# Patient Record
Sex: Male | Born: 1935
Health system: Southern US, Community
[De-identification: ages and names within clinical notes are randomized; demographics above are authoritative.]

## PROBLEM LIST (undated history)

## (undated) DIAGNOSIS — I2510 Atherosclerotic heart disease of native coronary artery without angina pectoris: Secondary | ICD-10-CM

## (undated) DIAGNOSIS — I10 Essential (primary) hypertension: Secondary | ICD-10-CM

## (undated) DIAGNOSIS — IMO0001 Reserved for inherently not codable concepts without codable children: Secondary | ICD-10-CM

## (undated) DIAGNOSIS — I499 Cardiac arrhythmia, unspecified: Secondary | ICD-10-CM

## (undated) DIAGNOSIS — R609 Edema, unspecified: Secondary | ICD-10-CM

## (undated) DIAGNOSIS — I340 Nonrheumatic mitral (valve) insufficiency: Secondary | ICD-10-CM

## (undated) HISTORY — DX: Atherosclerotic heart disease of native coronary artery without angina pectoris: I25.10

## (undated) HISTORY — DX: Essential (primary) hypertension: I10

## (undated) HISTORY — DX: Nonrheumatic mitral (valve) insufficiency: I34.0

## (undated) HISTORY — DX: Edema, unspecified: R60.9

## (undated) HISTORY — DX: Cardiac arrhythmia, unspecified: I49.9

## (undated) HISTORY — PX: OTHER SURGICAL HISTORY: SHX169

## (undated) HISTORY — DX: Reserved for inherently not codable concepts without codable children: IMO0001

## (undated) HISTORY — PX: LUMBAR LAMINECTOMY: SHX95

## (undated) HISTORY — PX: INGUINAL HERNIA REPAIR: SHX194

---

## 1998-02-03 ENCOUNTER — Ambulatory Visit (HOSPITAL_COMMUNITY): Admission: RE | Admit: 1998-02-03 | Discharge: 1998-02-03 | Payer: Self-pay | Admitting: Cardiology

## 2003-06-26 ENCOUNTER — Ambulatory Visit (HOSPITAL_COMMUNITY): Admission: RE | Admit: 2003-06-26 | Discharge: 2003-06-26 | Payer: Self-pay | Admitting: Gastroenterology

## 2005-02-22 ENCOUNTER — Encounter: Admission: RE | Admit: 2005-02-22 | Discharge: 2005-02-22 | Payer: Self-pay | Admitting: Orthopedic Surgery

## 2006-10-25 HISTORY — PX: CORONARY ARTERY BYPASS GRAFT: SHX141

## 2007-05-30 ENCOUNTER — Inpatient Hospital Stay (HOSPITAL_COMMUNITY): Admission: EM | Admit: 2007-05-30 | Discharge: 2007-06-12 | Payer: Self-pay | Admitting: Emergency Medicine

## 2007-06-01 ENCOUNTER — Encounter (INDEPENDENT_AMBULATORY_CARE_PROVIDER_SITE_OTHER): Payer: Self-pay | Admitting: Cardiology

## 2007-06-02 ENCOUNTER — Ambulatory Visit: Payer: Self-pay | Admitting: Cardiothoracic Surgery

## 2007-06-30 ENCOUNTER — Encounter: Admission: RE | Admit: 2007-06-30 | Discharge: 2007-06-30 | Payer: Self-pay | Admitting: Cardiothoracic Surgery

## 2007-06-30 ENCOUNTER — Ambulatory Visit: Payer: Self-pay | Admitting: Cardiothoracic Surgery

## 2007-07-06 ENCOUNTER — Encounter (HOSPITAL_COMMUNITY): Admission: RE | Admit: 2007-07-06 | Discharge: 2007-10-04 | Payer: Self-pay | Admitting: Cardiology

## 2007-08-03 ENCOUNTER — Ambulatory Visit: Payer: Self-pay | Admitting: Cardiothoracic Surgery

## 2007-08-03 ENCOUNTER — Encounter: Admission: RE | Admit: 2007-08-03 | Discharge: 2007-08-03 | Payer: Self-pay | Admitting: Cardiothoracic Surgery

## 2007-11-20 ENCOUNTER — Encounter: Admission: RE | Admit: 2007-11-20 | Discharge: 2007-11-20 | Payer: Self-pay | Admitting: Cardiology

## 2009-05-06 IMAGING — CR DG CHEST 1V PORT
1 series · 1 of 1 positions shown · non-contrast
Comparison: none

CLINICAL DATA: Chest pain

Portable chest 1174:
No previous for comparison. There is some linear platelike atelectasis or
scarring in the left lower lobe. Right lung clear. Relatively low lung volumes.
Heart size is upper limits normal. No effusion. Visualized bones unremarkable.

[view not recorded]
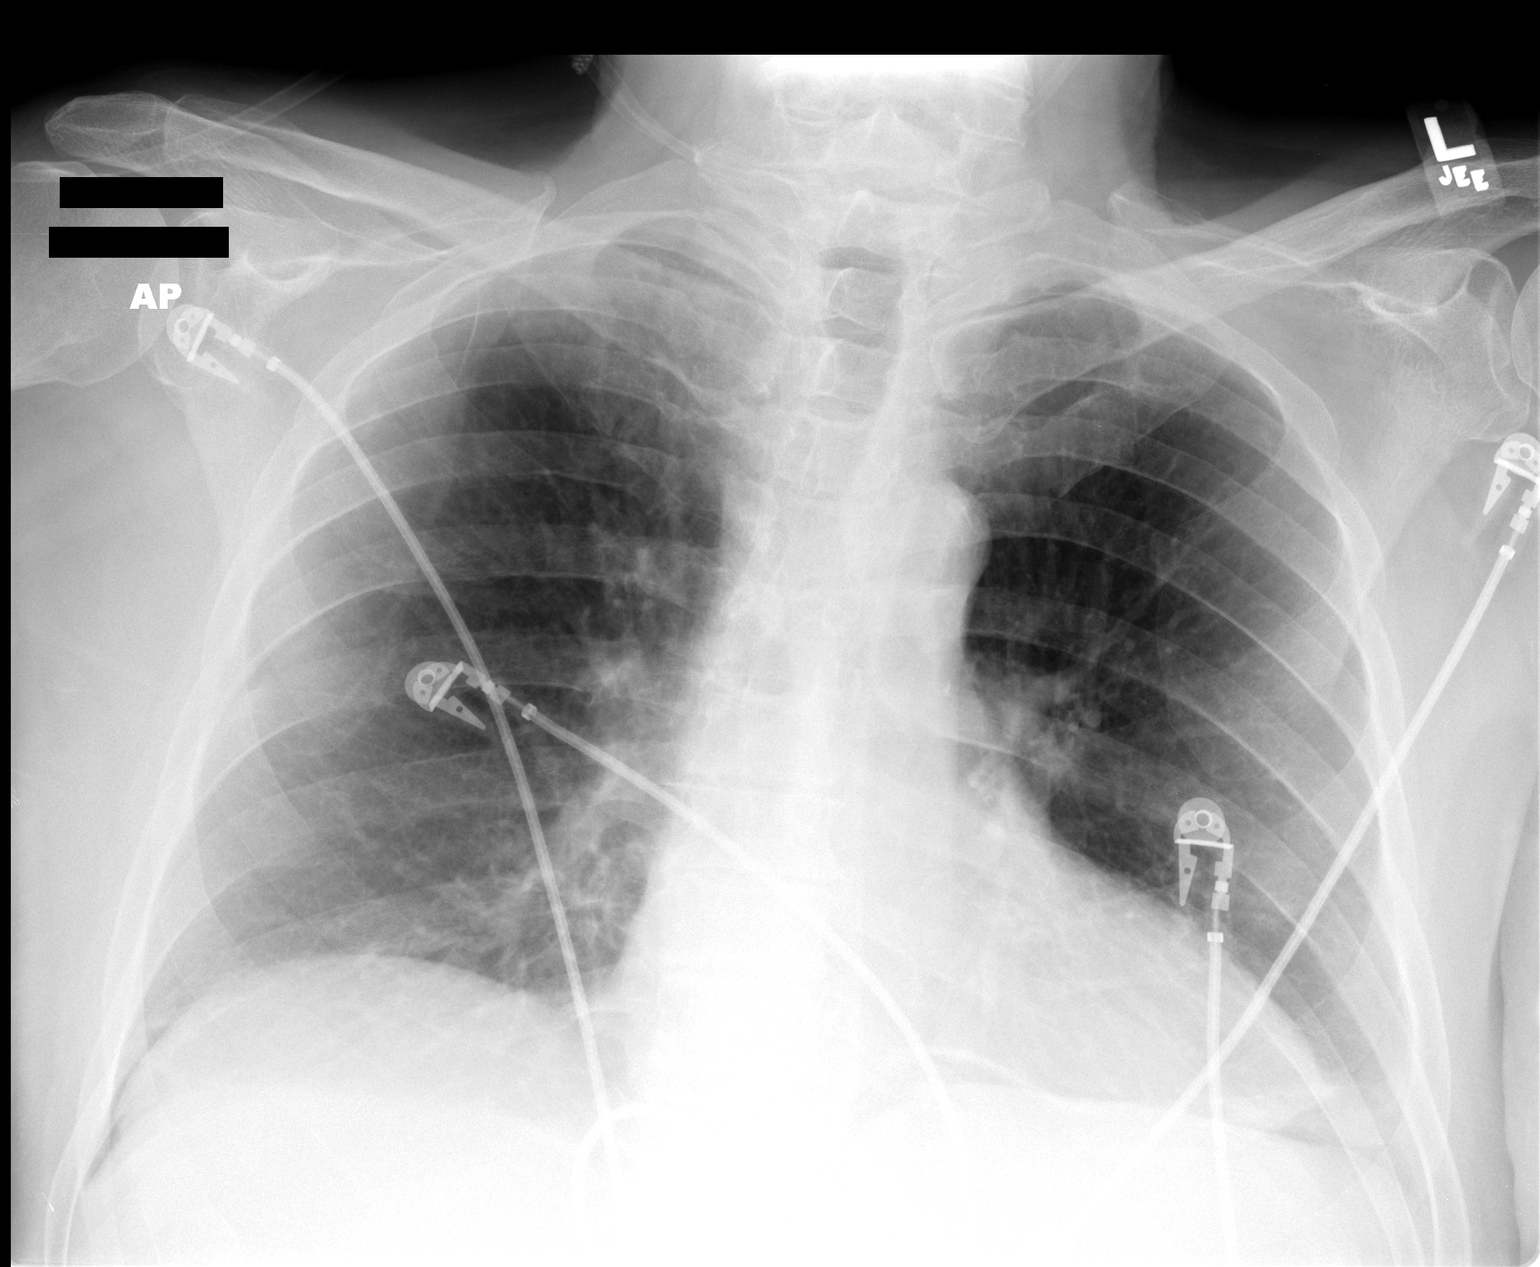

[1 of 1 positions shown; findings below may reference images not displayed]

IMPRESSION: 1. Low lung volumes. Linear atelectasis or scarring, left lower lobe.

## 2009-06-06 IMAGING — CR DG CHEST 2V SAME DAY
2 series · 2 of 2 positions shown · non-contrast
Comparison: Earlier today.

CLINICAL DATA: Status post left thoracentesis. Recent CABG

CHEST - 2 VIEW

[w chest pa]
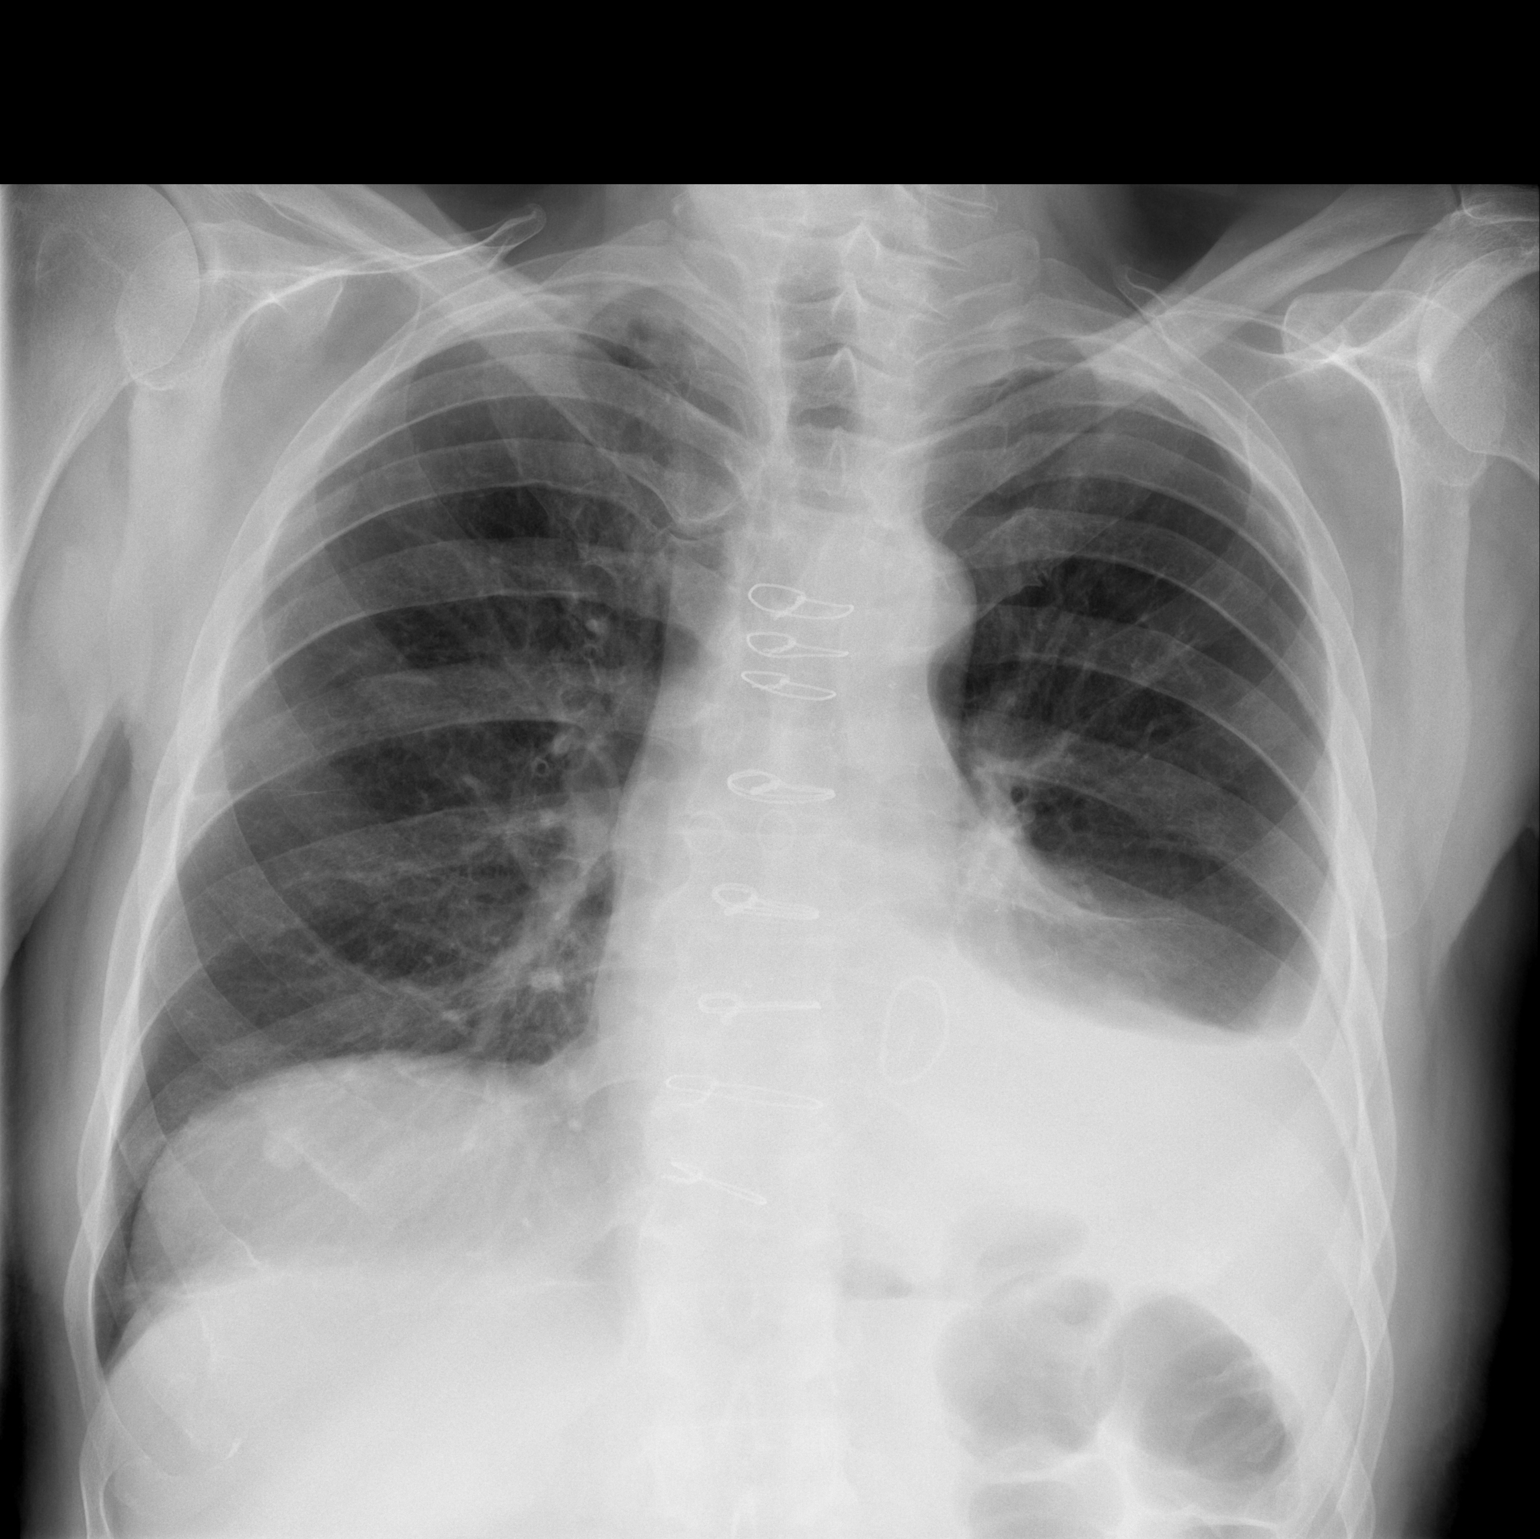

[w chest lat]
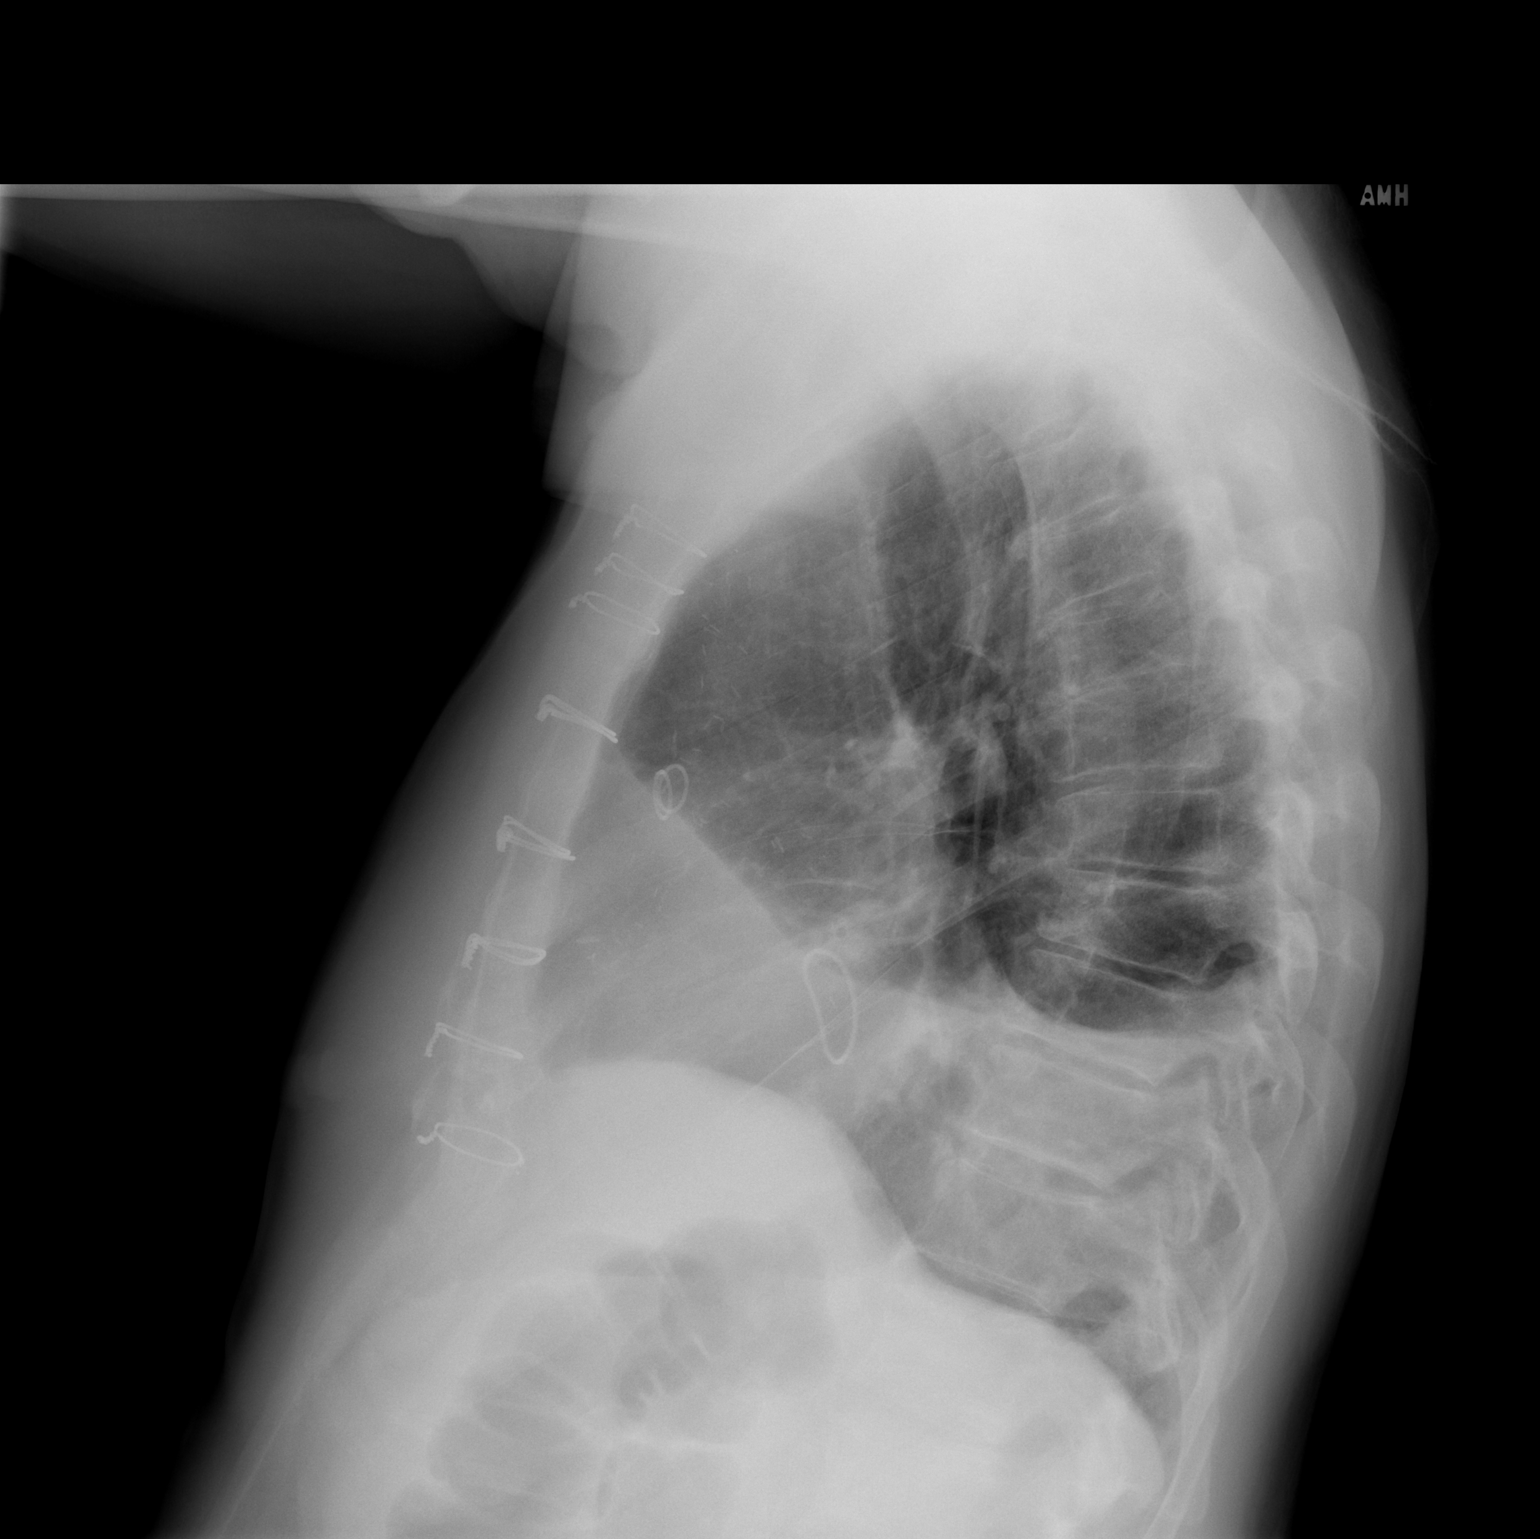

[2 of 2 positions shown; findings below may reference images not displayed]

FINDINGS: Median sternotomy and CABG. Midline trachea. Moderate cardiomegaly.
Minimal decrease in small-moderate left-sided pleural effusion. No pneumothorax.
Mild pulmonary venous congestion suspected. Density at the right lung base is
likely a nipple shadow.

IMPRESSION

1. Minimal decrease in left-sided pleural effusion without pneumothorax.
2. Cardiomegaly with suggestion of mild pulmonary venous congestion.
3. Probable right-sided nipple shadow. Repeat frontal film with nipple markers
could confirm.

## 2009-06-06 IMAGING — CR DG CHEST 2V
2 series · 2 of 2 positions shown · non-contrast
Comparison: [HOSPITAL] 06/09/2007 [REDACTED]
06/20/2007.

CLINICAL DATA: CABG. Tachycardia. 

CHEST - 2 VIEW

[view not recorded (1 of 2)]
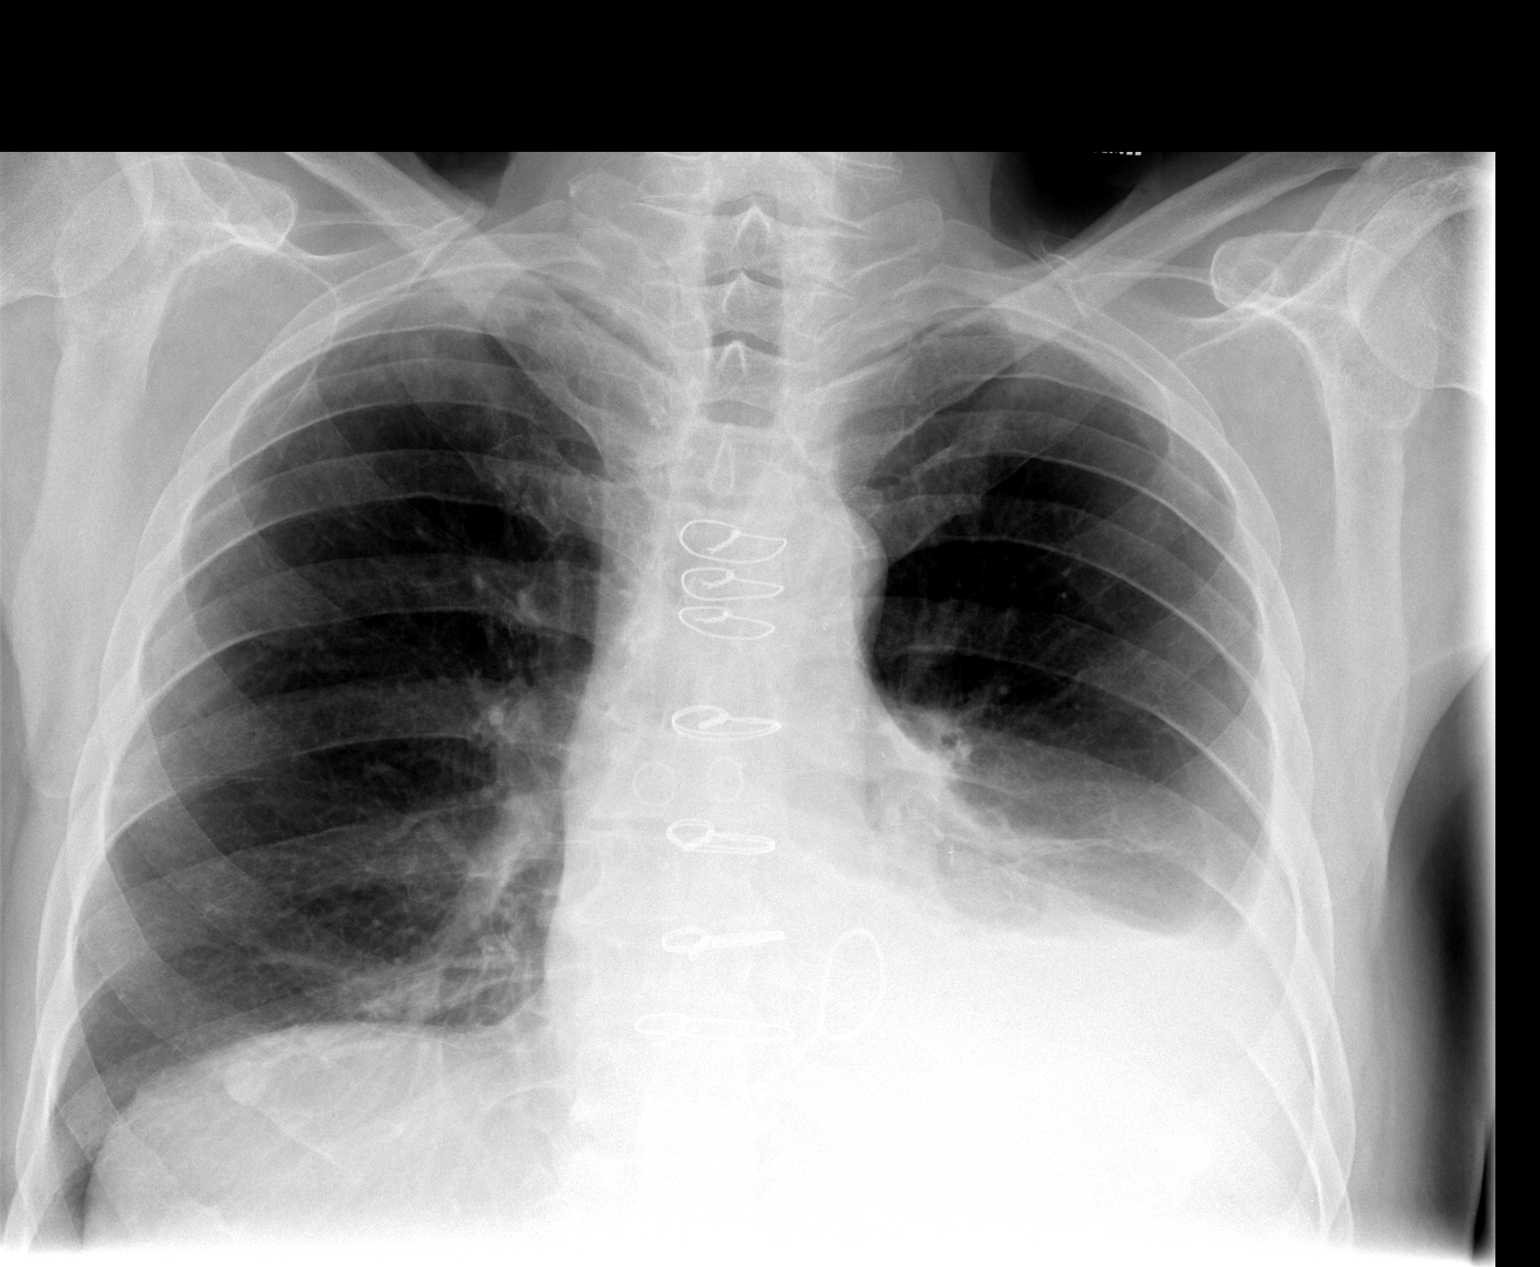

[view not recorded (2 of 2)]
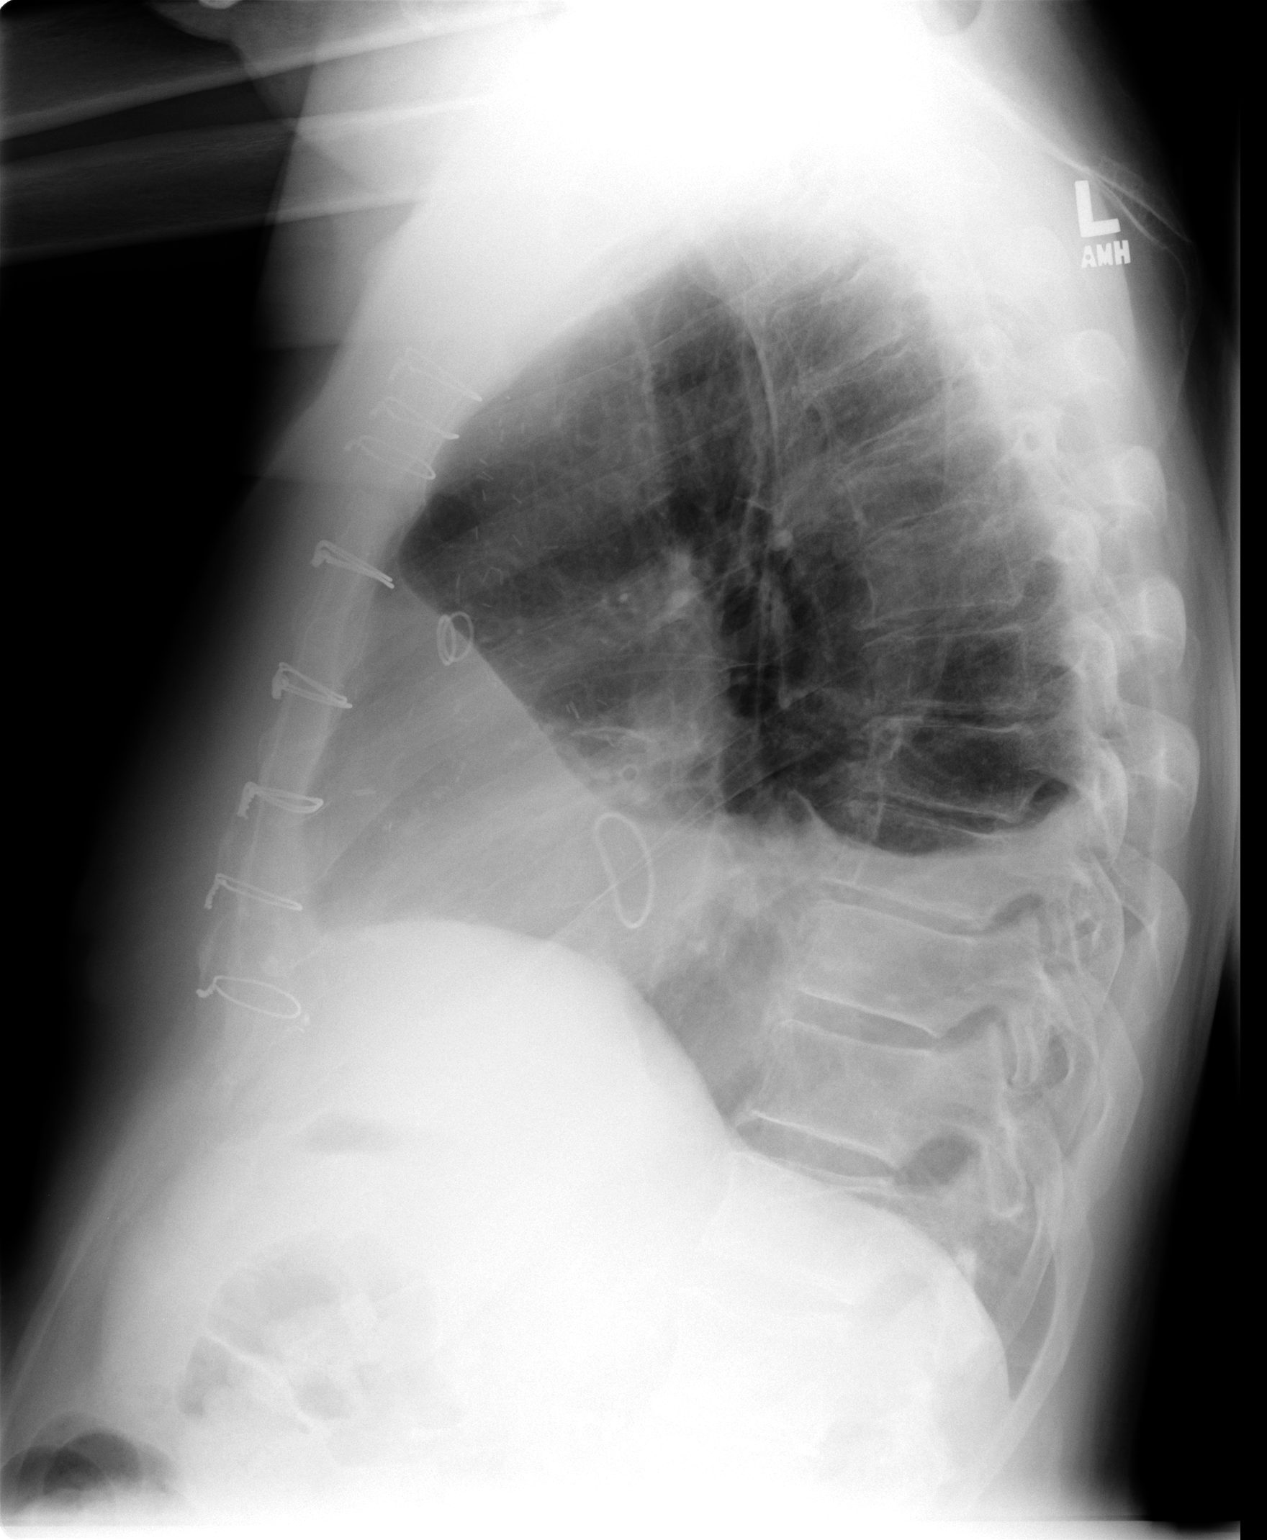

[2 of 2 positions shown; findings below may reference images not displayed]

FINDINGS: Median sternotomy CABG,  mitral valve repair.

Right costophrenic angle partially excluded from frontal view.

Midline trachea. Mild cardiomegaly. Resolved pulmonary edema. Decreased (since
06/20/2007) left-sided pleural effusion. Now small-moderate. No pneumothorax.
Left lower lobe atelectasis is also decreased since 06/20/2007. Clear right lung.
.

IMPRESSION

1. Since 06/20/2007 [REDACTED] Krier film, decrease in small-moderate
left-sided pleural effusion with decreased left base atelectasis.
2. Cardiomegaly without congestive failure

## 2010-11-15 ENCOUNTER — Encounter: Payer: Self-pay | Admitting: Cardiothoracic Surgery

## 2011-03-09 NOTE — Assessment & Plan Note (Signed)
OFFICE VISIT   [name], [name]  DOB:  [birth year]                                        August 03, 2007  CHART #:  [id number]   CURRENT PROBLEMS:  1. Status post CABG x3, mitral valve repair and Maze procedure      06/06/2007.  2. LV dysfunction with preoperative EF of 30%.  3. Left pleural effusion postoperatively, treated with thoracentesis      06/30/2007.   CURRENT ILLNESS:  The patient is an 77 year old male status post CABG  with mitral repair and Maze procedure in mid August.  He returns with a  chest x-ray to follow up the left pleural effusion, which was removed by  thoracentesis 4 weeks ago.  He is feeling much stronger, without angina,  without shortness of breath, and the surgical incisions are healing  well.  Dr. [name] started to taper the amiodarone and he also remains on  Coumadin for his history of atrial fibrillation.   PHYSICAL EXAMINATION:  On exam today, his vital signs are a blood  pressure of 121/60, pulse 76, respirations 18, and saturation 95%.  He  is alert and oriented.  Breath sounds are clear and equal.  Cardiac  rhythm is regular without murmur.  The sternum is stable, and the leg  incisions are healing well.  There is no peripheral edema.   A PA and lateral chest x-ray reveals clear lung fields and the left  pleural effusion has resolved.  A rhythm strip shows him to be in a  sinus rhythm.  His most recent INR was 2.5.   IMPRESSION AND PLAN:  The patient had does well, now 2 months after  surgery and is completing his cardiac rehab program uneventfully.  He  will remain at reduced activity and  lifting levels until mid November.  He will return to the care of Dr.  [name], and he will call us if he needs [place].   Kerin Perna, M.D.  Electronically Signed   PV/MEDQ  D:  08/03/2007  T:  08/04/2007  Job:  [id number]   cc:   Madaline Savage, M.D.  [facility] Office

## 2011-03-09 NOTE — Consult Note (Signed)
NAMECASSADY, TURANO                 ACCOUNT NO.:  [id number]   MEDICAL RECORD NO.:  [id number]          PATIENT TYPE:  INP   LOCATION:  6522                         FACILITY:  [facility]   PHYSICIAN:  [name], MD    DATE OF BIRTH:  1924/06/28   DATE OF CONSULTATION:  06/02/2007  DATE OF DISCHARGE:                                 CONSULTATION   PRIORITY CARDIAC SURGERY CONSULTATION   CARDIAC CATH PHYSICIAN:  Dr. Cristy Hilts. [place].   PRIMARY CARE PHYSICIAN:  Dr. Molly Maduro Day, [facility].   REASON FOR CONSULTATION:  Three-vessel coronary artery disease, mitral  regurgitation, and recent onset of atrial flutter.   HISTORY OF PRESENT ILLNESS:  The patient is an 77 year old male, who  denies any specific symptoms, went to Dr. [name] for a regular visit and at  the time was found to have an EKG, which showed a new atrial flutter.  The patient was unaware of change in rhythm.  He notes that he has been  active and has had no overt symptoms of congestive heart failure.  He  recently was at the beach, and the family noted that he ambulated well  without complaint of shortness of breath or difficulty keeping up with  the other family members.  He has had a previous history of myocardial  infarction in 1997 and underwent angioplasty of the right coronary  artery by Dr. [name].  Recent echo shows ejection fraction of 20 to 30%  with severe, diffuse hypokinesis, severe mitral regurgitation, and mild  to moderate tricuspid regurgitation.  Because of the new onset of atrial  flutter, the patient was admitted, and an echocardiogram and cardiac  catheterization were performed.   CARDIAC RISK FACTORS:  The patient does have a history of hypertension,  a history of hyperlipidemia, and a positive family history.  His father  had an MI at a young age.  He has three brothers with coronary artery  disease, and two half-brothers, who have had bypass surgery.  He denies  stroke, denies  claudication, denies renal insufficiency.   PAST SURGICAL HISTORY:  1. Three hernia operations in the 1950s.  2. Knee surgery on the right in the 1970s.  3. Laminectomy more than 20 years ago.  4. He has epidural injections, the most recent one 2 months ago.   SOCIAL HISTORY:  Patient is married and lives in [place] with his wife.  The patient worked at [employer] for more than 35 years and retired  in 1992.  Denies alcohol use.  The patient's daughter works at [employer].   MEDICATIONS:  1. Aspirin 81 mg.  2. Simvastatin 20 mg.  3. Temazepam 30 mg a day.  4. Lisinopril 20 mg a day.  5. Benadryl 15 mg b.i.d. p.r.n.  6. Omacor p.o. b.i.d.   DRUG ALLERGIES:  LOPRESSOR causes fatigue.   CARDIAC REVIEW OF SYSTEMS:  The patient denies chest pain, denies  resting shortness of breath, exertional shortness of breath, orthopnea,  presyncope, syncope, palpitations, lower extremity edema.   GENERAL REVIEW OF SYSTEMS:  Denies any constitutional symptoms.  Denies  fevers, chills, or night sweats.  GASTROINTESTINAL:  He has a history of  diverticulosis.  NEUROLOGIC:  Denies amaurosis or TIAs.  He has had  chronic back pain and some left leg difficulties related to his back.  Denies problem with urination.  Denies psychiatric history.  Last dental  appointment was in April without any obvious dental problems.  Does wear  glasses.  A carotid Doppler showed 40 to 60% right internal carotid  artery stenosis.   PHYSICAL EXAMINATION:  VITAL SIGNS:  Blood pressure of 134/56, pulse of  95, respiratory rate is 20, O2 SAT is 93% on room air, 74 inches tall,  98.8 kg.  GENERAL:  The patient is awake, alert, and neurologically intact,  appears younger than his stated age.  NECK:  Carotid bruits are not appreciated.  He has no jugulovenous  distention.  LUNGS:  Clear bilaterally.  CARDIAC:  An irregular rate with a 2/6 systolic murmur at the left apex,  nonradiating.  Considering the  degree of regurgitation on echo, the  murmur is unimpressive.  ABDOMEN:  Benign without palpable masses.  LOWER EXTREMITIES:  +1 DP and PT pulses, appears to have an adequate  vein in both lower extremities for bypass.   LABORATORY DATA:  Hematocrit of 42.5, hemoglobin 14.2, total cholesterol  154, triglycerides 65, HDL 52, and LDL 89.  Creatinine is 1.1.  EKG  shows atrial flutter.   Cardiac catheterization films were reviewed.  Patient has approximately  25% ejection fraction with cardiac enlargement.  The degree of mitral  regurgitation is very difficult to appreciate on the ventriculogram due  to the large ventricular size.  There is a 50% left main, a 70 to 80%  proximal LAD, an 80% proximal circ, and a 70% to 80% mid lesion.  There  is distal disease in the circumflex, but it does appear bypassable.  The  right coronary artery is total with collateral filling from the left.   IMPRESSION:  An 77 year old male, who surprisingly does not complain of  many symptoms but does have significant three-vessel coronary artery  disease, new onset of atrial flutter, and by echocardiogram what appears  to be severe mitral regurgitation.  With the degree of his coronary  disease and left ventricular dysfunction that is not amenable to  angioplasty, I do agree with Dr. [name] that coronary artery bypass  grafting would be indicated for significant three-vessel disease.  In  addition, consideration for a mitral valve repair or replacement with  the degree of mitral regurgitation appreciated on echocardiogram.  Consideration for a Maze procedure in addition is discussed with the  patient.  The risks and options were discussed with the patient and his  family in detail including the risk of the procedure including death,  infection, stroke, myocardial infarction, bleeding, blood transfusion.  The patient is willing to proceed with the degree of his coronary  disease and now a new onset of atrial  flutter.  I have recommended that  the patient stay in the hospital and proceed with bypass surgery on this  admission rather than  starting Coumadin and having him return later.  As I will be working in  [employer] for the coming month, I will discuss the case with Dr. [name], who the patient's family has requested and tentatively he could  proceed with surgery on Tuesday, August 12th.      [name], MD  Electronically Signed  EG/MEDQ  D:  06/02/2007  T:  06/02/2007  Job:  [id number]   cc:   Alfredia Client, MD  Cristy Hilts. Jacinto Halim, MD

## 2011-03-09 NOTE — Discharge Summary (Signed)
Terry [name], Terry [name]                 ACCOUNT NO.:  [id number]   MEDICAL RECORD NO.:  [id number]          PATIENT TYPE:  INP   LOCATION:  2011                         FACILITY:  [facility]   PHYSICIAN:  Kerin Perna, M.D.  DATE OF BIRTH:  04-25-1924   DATE OF ADMISSION:  05/30/2007  DATE OF DISCHARGE:  06/12/2007                               DISCHARGE SUMMARY   Waiting old PAC taken.   DISCHARGE SUMMARY:  On course made and 818 medical record number [id number]  12/28/2016   DATE OF ADMISSION:  05/30/2007   DATE OF DISCHARGE:  05/1807 copy dictation Terry [name] try to MD and Terry [name] and the proper day, MD at lasting less Terry [name] rocking and the  family practice   HISTORY OF PRESENT ILLNESS:  The patient is an 77 year old male who  denies any specific symptoms, who upon seeing his primary physician, Dr.  [name], for a regular visit, he was found to have on a EKG, new atrial  flutter.  The patient was unaware of his rhythm change.  He notes that  he has been active and has had no overt symptoms of congestive heart  failure.  He recently was vacationing in [place], and he had no  significant difficulties of ambulation or complaints of shortness of  breath.  He does have a known history of coronary disease having  undergone previous angioplasty in 1997, following a myocardial  infarction.  A recent echocardiogram showed 20% to 30% ejection  fraction, with severe diffuse hypokinesis, severe mitral regurgitation  and mild-to-moderate tricuspid regurgitation.  Because of the new onset  of atrial flutter, he was admitted for an echocardiogram and cardiac  catheterization, further evaluation and treatment.   PAST MEDICAL HISTORY:  Includes hypertension, hyperlipidemia, positive  family history.   PAST SURGICAL HISTORY:  1. Three hernia operations in the 1950s.  2. Knee surgery on the right in the 1970s.  3. Laminectomy more than 20 years ago and multiple epidural      injections, the most recent  2 months ago.   MEDICATIONS ON ADMISSION:  Include:  1. Aspirin 81 mg daily.  2. Simvastatin 20 mg daily.  3. Temazepam 30 mg daily.  4. Lisinopril 20 mg daily.  5. Benadryl 50 mg b.i.d. p.r.n.  6. Omacor b.i.d.   ALLERGIES:  LOPRESSOR IS KNOWN TO CAUSE FATIGUE WITH NO DEFINITIVE  ALLERGIES IN PATIENT'S CHART LISTED.   Family history, social history, review of systems, physical exam:  Please see the history and physical done at time admission.   HOSPITAL COURSE:  The patient was admitted to the cardiology service of  [facility].  He was scheduled and underwent cardiac  catheterization on May 31, 2007.  He was found to have severe triple-  vessel coronary disease and at least moderately severe mitral  regurgitation, with a large left atrium.  The patient was then referred  in cardiac surgical consultation to [name], MD, who evaluated  the patient and studies and recommended proceeding with surgical  revascularization,as well as mitral valve repair.  The lesions  noted on  the catheterization included a 50% left main,70% to 80%  proximal LAD,  80%  proximal circ and 70% to 80% mid lesion.  There was distal disease  in the circumflex, but it appeared to be bypassable.  The right coronary  was totally occluded with collateral filling from the left.  The patient  was then scheduled to undergo the surgical procedure, and the family  requested Dr. [name] for this.   PROCEDURE:  On June 06, 2007, he underwent the following procedure:  Coronary artery bypass grafting x3.  The following grafts were placed.  1. Left internal mammary artery to the LAD.  2. Saphenous vein graft to the posterior descending.  3. Saphenous vein graft to the circumflex.  The patient also had      mitral valve repair with a 28-mm Edwards annuloplasty ring.  He      also underwent a left and right-sided Maze procedure and stapling      of the left atrial appendage.  He tolerated  this procedure well,      was taken to the surgical intensive care unit in stable condition.   POSTOPERATIVE HOSPITAL COURSE:  The patient has overall done well.  He  has had some cardiac dysrhythmias, including bradyarrhythmias,  junctional arrhythmias, atrial tachycardias, to include atrial flutter.  His rhythm has stabilized on his current medical regimen, however, and  he is also on Coumadin, due to the atrial flutter with repair of the  mitral valve.  The patient's pulmonary status has improved slowly and  gradually.  His volume status is also improved, with a gentle diuresis.  All routine lines, monitors and drainage devices have been discontinued  in the standard fashion.  He has been placed on Coumadin for his  dysrhythmias and currently has an INR of 1.9 on June 12, 2007.  He is  tolerating  a routine advancement activity commensurate for level of  postoperative convalescence, using standard cardiac rehabilitation phase  I modalities.   LABORATORY:  His laboratory values are stable.  His most recent  hemoglobin/hematocrit dated June 10, 2007, are 10 and 29,  respectively.  Electrolytes, BUN and creatinine are within normal  limits.   INSTRUCTIONS:  The patient received written instructions regarding  medications, activity, diet, wound care and follow-up.  Follow-up with  Dr. gamble in 2 weeks, Dr. [name] in 3 weeks.  Additionally, he will  have a home nurse draw his PT/INR with results to Dr. [name].   MEDICATIONS ON DISCHARGE:  1. Aspirin 81 mg daily,  2. Toprol XL 25 mg daily.  3. Lisinopril 20 mg daily.  4. Zocor 20 mg daily.  5. Oxycodone one or two every 4-6 hours as needed.  6. Coumadin 5 mg daily and as directed through [facility] per      Dr. [name].  7. Aldactone 25 mg daily.  8. Amiodarone 200 mg twice daily.  9. Omacor 1 gram twice daily.  10.Temazepam p.r.n. for pain.   CONDITION ON DISCHARGE:  Is stable, improving.   FINAL DIAGNOSES:   1. Severe multivessel coronary artery disease as described.  2. Ischemic cardiomyopathy with ejection fraction 20% to 30%.  3. Mitral regurgitation, now status post repair, with ring      annuloplasty.   OTHER DIAGNOSES:  1. Include atrial flutter.  2. Status post Maze procedure.  3. Coronary artery disease.  4. Hypertension.  5. Dyslipidemia and postoperative anemia.  6. Mitral regurgitation, status post repair.  7. Ischemic cardiomyopathy, 20% to 30% ejection fraction.      Rowe Clack, P.A.-C.      Kerin Perna, M.D.  Electronically Signed    WEG/MEDQ  D:  06/12/2007  T:  06/12/2007  Job:  [id number]   cc:   Kerin Perna, M.D.  Cristy Hilts. Jacinto Halim, MD  Alfredia Client, M.D.

## 2011-03-09 NOTE — Op Note (Signed)
Terry [name], Terry [name]                 ACCOUNT NO.:  [id number]   MEDICAL RECORD NO.:  [id number]          PATIENT TYPE:  INP   LOCATION:  2311                         FACILITY:  [facility]   PHYSICIAN:  Kerin Perna, M.D.  DATE OF BIRTH:  02/03/1924   DATE OF PROCEDURE:  06/06/2007  DATE OF DISCHARGE:                               OPERATIVE REPORT   OPERATION:  1. Coronary artery bypass grafting x3 (left internal mammary artery to      left anterior descending, saphenous vein graft to circumflex      marginal, saphenous vein graft to posterior descending).  2. Endoscopic vein harvest of the greater saphenous vein from both the      left and right leg.  3. Mitral valve annuloplasty repair for ischemic mitral regurgitation      with a 28 mm McCarthy-Adams ring.  4. Left atrial and right atrial MAZE procedure for preoperative atrial      flutter.  5. Stapling of left atrial appendage.   PREOPERATIVE DIAGNOSIS:  Severe three vessel coronary disease, ischemic  mitral regurgitation and ischemic cardiomyopathy, preoperative atrial  flutter.   POSTOPERATIVE DIAGNOSIS:  Severe three vessel coronary disease, ischemic  mitral regurgitation and ischemic cardiomyopathy, preoperative atrial  flutter.   SURGEON:  Kerin Perna, M.D.   ASSISTANT:  Zadie Rhine, P.A.-C.  Lenise Herald, [place]   ANESTHESIA:  General.   INDICATIONS:  The patient is an 77 year old male who presented with  weakness and was found to be in rapid atrial flutter.  Cardiac enzymes  were mildly elevated.  He underwent cardiac catheterization and a 2D  echo.  These studies demonstrated severe three vessel coronary artery  disease, reduced LV function with an EF of 25-30%, moderate to severe  mitral regurgitation, and mild tricuspid regurgitation.  He is felt to  be a candidate for surgical coronary evaluation, mitral valve repair,  and MAZE procedure.  I examined the patient in his hospital room and  reviewed the  results of his cath and echo with the patient and family.  I discussed the indications and benefits of coronary bypass surgery,  mitral valve repair, and the MAZE procedure for his preoperative  diagnoses as listed above.  I reviewed the expected benefits,  alternatives, and associated risks of stroke, bleeding, MI, infection,  and death.  After reviewing the issues, the patient demonstrated his  understanding and wished to proceed with surgery.   OPERATIVE FINDINGS:  Saphenous vein was harvested endoscopically from  both legs.  The vein in the right leg had an area of small caliber which  had to be used and was placed to the vein graft to the right coronary as  this was the smallest vessel.  The distal circumflex was heavily  diseased and the graft was placed just at a bifurcated segment.  The  mammary artery was a good vessel with excellent flow.  Following  surgery, the TEE showed improved global LV function with resolution of  the mitral regurgitation which was from a restricted posterior leaflet  etiology.  The patient received 1 unit of  platelets in the operating  room for a platelet count of 100,000.   PROCEDURE:  The patient was brought to the operating room and placed  supine on the operating room table where general anesthesia was induced.  The chest, abdomen and legs were prepped with Betadine and draped as a  sterile field.  A sternal incision was made as the saphenous vein was  harvested endoscopically from first the right leg and then the left leg.  The internal mammary artery was harvested as a pedicle graft from its  origin at the subclavian vessels.  A sternal retractor was placed and  once the vein had been harvested and examined, heparin was administered.  A pursestring was placed in the ascending aorta and right atrium.  The  patient was cannulated and placed on bypass.  A second pursestring was  placed in the right atrium for bicaval atrial drainage.  The  coronaries  were identified for grafting and the interatrial groove was dissected  out.  Vessel loops were placed around the SVC and IVC cannula.  Cardioplegia catheters were placed for both antegrade and retrograde  cold blood cardioplegia.   The patient was cooled to 30 degrees and the aortic crossclamp was  applied.  850 mL of cold blood cardioplegia was delivered in split doses  between the antegrade aortic and retrograde coronary sinus catheters.  There is good cardioplegic arrest and septal temperature dropped to less  than 12 degrees.  While the crossclamp was in place, cardioplegia was  delivered every 15-20 minutes.   The distal coronary anastomoses were then performed.  The first distal  anastomosis was to the posterior descending.  This was totally occluded  proximally.  A reversed saphenous vein was sewn end-to-side.  Part of  the vein had an area of small caliber in the mid section of the graft.  There was good flow through the anastomosis and cardioplegia was  redosed.  The second distal anastomosis was to the distal circumflex  which was heavily and diffusely diseased.  A reversed saphenous vein was  sewn end-to-side to this vessel which had a proximal 90% stenosis and  there is good flow through the graft.  The third distal anastomosis was  to the distal LAD.  This also had a proximal 80-90% stenosis.  Then, the  left IMA pedicle was brought through an opening created in the left  lateral pericardium, was brought down onto the LAD, and sewn end-to-side  with running 8-0 Prolene.  There was is good flow through the  anastomosis after briefly releasing the pedicle bulldog on the mammary  artery.  The bulldog was reapplied and the pedicle was secured to the  epicardium.  Cardioplegia was redosed.   Attention was then directed to the mitral valve.  The vessel loops were  tightened around the caval cannula and a left atriotomy incision was  performed.  The atrial retractors  were placed for exposure of the mitral  valve.  Exposure was adequate to good.  The heart was significantly  large and the atrium was very deep.  A saline test was performed and it  appeared the leak occurred at the P3 segment from restriction of the  posterior leaflet from prior ischemic heart damage (type 3D etiology).  The anterior leaflet was sized for a 28 mm McCarthy-Adams ring.  12  angioplasty sutures of 2-0 Ethibond were then placed around the annulus.  These were then placed through the sewing ring at the appropriate  intervals  and the sewing ring was seated and sutures were tied.  The  valve was then again tested and there was no mitral regurgitation.   The atrial incision was then closed after the left sided MAZE had been  completed.  The left sided MAZE was initially performed around the left  sided pulmonary veins after the patient was crossclamped and bipolar  radiofrequency ablation device was placed around the cuff of the left  atrial veins and an ablation line created.  An ablation line was also  created at the base of the left atrial appendage at that time and the  appendage was then stapled with the Ethicon stapler.  After the mitral  valve repair, the right sided pulmonary vein ablation line was completed  posteriorly using the unipolar radiofrequency ablation pen from the  atrial incision inferiorly.  Next, a bipolar ablation line was placed  between the two pulmonary vein encircling ablation lines across the  floor of the left atrium.  Next, a bipolar ablation line was placed from  the left atrial incision across to the mitral valve annulus to the P3  segment of the valve.  This completed the left sided atrial MAZE  procedure and the atriotomy was closed in two layers using 4-0 Prolene.  Cardioplegia was redosed.   Next, the right sided atrial MAZE procedure was performed.  A vertical  incision was made in the crista terminalis to the base of the right  atrium.   The bipolar clamp was then used to make an ablation line both  inferiorly towards the IVC and superiorly towards the SVC.  A second  ablation line across the infundibulum of the right atrium was created  with the unipolar pen extending from the atrial incision through the  crista terminalis towards the tricuspid annulus just inferior to the  coronary sinus and stopping just short of the tricuspid annulus.  Finally, a bipolar ablation line was created from the base of the right  atrial appendage laterally keeping well away from the surgical incision  to the lower aspect of the right atrium.  Next, the small incision at  the base of the atrial appendage was closed using a pledgeted 4-0  Prolene.  The larger incision extending to the crista terminalis was  closed using running 4-0 Prolene in two layers.   Next, the proximal coronary anastomoses were placed on the ascending  aorta using a 4.5 mm punch and running 6-0 Prolene.  Prior to tying down  the final proximal anastomosis, air was vented from the coronaries and  the left side of the heart using a dose of retrograde warm blood  cardioplegia and the usual filling maneuvers on bypass.  The final  proximal anastomosis was tied and the crossclamp was removed.   Air was aspirated from the vein grafts with the 27 gauge needle and the  cardioplegia cannulae were removed.  The vein grafts were checked and  found to be with good flow and hemostasis was documented in the proximal  and distal anastomoses.  The atrial incision lines were checked and  found be hemostatic.  The patient was rewarmed to 37 degrees.  Temporary  pacing wires were placed on the right atrium, right ventricle and a left  ventricular pacing lead was also applied.  After the patient had been  adequately reperfused and rewarmed, the lungs were re-expanded and the  ventilator was resumed.  The patient was then weaned from bypass using  low dose dopamine and milrinone.  Cardiac  output, blood pressure, and  hemodynamics were stable.  The patient was in an AV sequentially paced  rhythm.  The echo showed improvement in global LV function and no mitral  regurgitation.   The cannulae were removed after protamine was administered and there was  no adverse reaction.  After full protamine dose had been delivered, the  patient was given a unit of platelets as his platelet count on bypass  was marginally depressed.  The mediastinum was irrigated with warm  antibiotic irrigation.  The leg incisions were irrigated and closed in a  standard fashion and the superior pericardium was closed.  Two  mediastinal and a left pleural chest tube were placed and brought out  through separate incisions.  The sternum was closed with interrupted  steel wire.  The pectoralis fascia was closed with a running #1 Vicryl  and subcutaneous and skin layers were closed with Vicryl.  Total bypass  time was 150 minutes.      Kerin Perna, M.D.  Electronically Signed     PV/MEDQ  D:  06/06/2007  T:  06/07/2007  Job:  [id number]   cc:   Cristy Hilts. Jacinto Halim, MD

## 2011-03-09 NOTE — Assessment & Plan Note (Signed)
OFFICE VISIT   Terry [name], Terry [name]  DOB:  03-Jul-1924                                        June 30, 2007  CHART #:  [id number]   CURRENT PROBLEMS:  1. Status post CABG x3, mitral valve repair, Mays procedure,      06/06/2007.  2. LV dysfunction with preoperative EF of 25% to 30%.  3. Post-hospital discharge left plural effusion.  4. Postoperative short term Coumadin therapy for mitral valve repair      and Mays procedure.   PRESENT ILLNESS:  Terry [name] is an 77 year old male who underwent CABG,  mitral repair, and Mays procedure 4 weeks ago and returns for his first  office visit. He is taking Coumadin and has had no bleeding  complications. His incisions are healing well and he denies any  significant chest pain, shortness of breath, edema, or fever.   He remains on his discharge medications as listed in the discharge  summary including  1. Toprol  XL 25 mg  2. Lisinopril 20 mg.  3. Zocor 20 mg.  4. Coumadin 2.5 mg.  5. Aldactone 25 mg.  6. Amiodarone 200 mg b.i.d.  7. Restoril.   PHYSICAL EXAMINATION:  VITAL SIGNS:  Blood pressure 120/60, pulse 84 and  regular, respirations 18, saturation 99%.  GENERAL:  He is alert and pleasant.  LUNGS:  Breath sounds are diminished at the left base, otherwise clear.  CARDIAC:  Rhythm is regular and there is no murmur of mitral  regurgitation.  CHEST:  The sternum is well healed and stable.  EXTREMITIES:  The leg incisions are well healed and stable. There is no  pedal edema.   A_PA and_ lateral chest x-ray shows a new left plural effusion since his  last x-ray in the hospital.   After informed consent was obtained a left thoracentesis was performed  and 850 cc of __sero- sanguineous fluid was removed. A post procedure  chest x-ray showed no pneumothorax with decrease in the left effusion.   PLAN:  The patient was instructed to work harder with his incentive  spirometer. Otherwise, he will be able to begin  driving and light  activities, and he was encouraged to enter the rehab program. He will  continue his medications which will be controlled by Dr. [name]. His  rhythm strip today appears to be a junctional rhythm. I plan on seeing  him back in four weeks with a chest x-ray to follow up on the left  plural effusion.   Kerin Perna, M.D.  Electronically Signed   PV/MEDQ  D:  06/30/2007  T:  07/01/2007  Job:  [id number]   cc:   Madaline Savage, M.D.

## 2011-03-09 NOTE — Cardiovascular Report (Signed)
Terry [name]                 ACCOUNT NO.:  [id number]   MEDICAL RECORD NO.:  [id number]          PATIENT TYPE:  INP   LOCATION:  2917                         FACILITY:  [facility]   PHYSICIAN:  Cristy Hilts. Jacinto Halim, MD       DATE OF BIRTH:  07-10-1924   DATE OF PROCEDURE:  05/31/2007  DATE OF DISCHARGE:                            CARDIAC CATHETERIZATION   ATTENDING CARDIOLOGIST:  Dr. Chanda Busing.   REFERRING PHYSICIAN:  Dr. Molly Maduro Day.   PROCEDURE PERFORMED:  1. Left ventriculography.  2. Selective right and left coronary arteriography.  3. Left subclavian arteriogram with LIMA.  4. Abdominal aortogram.   INDICATIONS:  Terry [name] is an 77 year old pleasant gentleman with  history of known coronary artery disease, history of acute inferior wall  myocardial infarction and had undergone PTCA and stenting to his right  coronary artery about 12 years ago.  In 1997, he had a cardiac  catheterization that had revealed a 75% proximal LAD stenosis, a 50% mid  circumflex stenosis and a 50-60% mid to distal circumflex stenosis.  He  had ulcerated lesion in the mid RCA and had undergone PTCA and stenting  in 1997 with implantation of a Palmaz Schatz stents.  Last Cardiolite  was in July 2006, which revealed inferior scar with mild peri-infarct  ischemia and normal ejection fraction.  He was admitted to the hospital  after he was found to be in atrial flutter with rapid ventricular  response in the primary care office.  He had denied any chest pain or  shortness of breath.   Cardiac catheterization is being performed to evaluate for myocardial  ischemia as an etiology for his atrial flutter.   Abdominal aortogram was performed to evaluate for abdominal  atherosclerosis.   HEMODYNAMIC DATA:  The left ventricular pressure 110/5 with end of  pressure of 18 mmHg.  Aortic pressure was 108/50 with a mean of 72 mmHg.  There was no pressure gradient across the aortic valve.   ANGIOGRAPHIC  DATA:  Left ventricle:  Left ventricular systolic function  was moderate to markedly depressed with ejection fraction of 35%.  There  was inferior wall akinesis, inferior and inferoseptal hypokinesis.  The  left ventricle was mildly dilated.  There was moderate to moderately  severe mitral regurgitation.  The left ventricle, although, was not well  visualized.  Left atrium appeared to be large.   Right coronary artery:  Right coronary artery is occluded in is proximal  segment.  The distal right coronary artery is supplied by collaterals  from the LAD and circumflex coronary artery.   Left main:  Left main has a calcific 50% stenosis.  The diameter the  lumen diameter of the left main is equivalent to the mid-to-distal LAD  diameter.   LAD:  LAD has got a calcific 70-80% long segment stenosis.  The stenosis  extends all the way from the ostium to the origin of a diagonal one  which is occluded.  The mid-to-distal LAD also has mild luminal  irregularity.   Circumflex coronary artery:  Circumflex coronary artery has  a tandem 80%  and a 70% stenosis in the proximal segment and a mid 70-80% stenosis.  There is moderate diffuse luminal irregularity in the mid segment.   Left subclavian artery and LIMA:  The left subclavian and LIMA are  widely patent.   Abdominal aortogram:  Abdominal aortogram revealed presence of two renal  arteries, one on either side.  They are widely patent.  There is  mild  to moderate amount of atherosclerotic changes in the abdominal aorta.  Right superficial femoral artery showed about a 30-40% stenosis.   IMPRESSION:  1. Severe triple-vessel coronary artery disease.  2. At least moderately severe mitral regurgitation with a large left      atrium.  Mitral regurgitation was not well visualized in spite of      injecting 36 mL of contrast.   RECOMMENDATIONS:  I will discuss the findings with Dr. Chanda Busing,  who is the attending cardiologist, to make  further recommendations  regarding therapy, whether medical, surgical or percutaneous coronary  revascularization.  His mitral regurgitation needs to evaluated.  I will  obtain an echocardiogram in the morning.   Patient tolerated the procedure well.  A total of 155 mL of contrast was  utilized for diagnostic angiography.   TECHNIQUE OF PROCEDURE:  Under the usual sterile precautions using a 6-  French right femoral arterial access, a 6-French multipurpose B2  catheter was advanced to the ascending aorta over a J-wire and then  advanced into the left ventricle.  Left ventriculography was performed  both in LAO and RAO projection.  Catheter was flushed with saline and  pulled back into the ascending aorta and then the catheter was pulled  out of the body in the usual fashion.  A 6-French Judkins left 4  diagnostic catheter was utilized to engage the left main coronary  artery, angiography was performed.  Then a 6-French no-torque catheter  was utilized to engage the right coronary artery and angiography was  performed.  The same no-torque catheter was utilized to engage the left  subclavian artery and LIMA was visualized.  The catheter was then pulled  out of the body.   A 6-French angled pigtail catheter was advanced into the left ventricle  over a J-wire in the usual fashion and left ventriculography was  performed in the RAO projection.  The catheter was then pulled back into  the abdominal aorta and abdominal aortogram was performed.  Then the  catheter was pulled out of the body in the usual fashion.  Right femoral  angiography was performed through the arterial access sheath and the  access closed with StarClose.  This was being done as the patient needs  heparin for his atrial flutter.  There was no immediate complication  noted.      Cristy Hilts. Jacinto Halim, MD  Electronically Signed     JRG/MEDQ  D:  05/31/2007  T:  06/01/2007  Job:  [id number]   cc:   Alfredia Client, MD

## 2011-03-12 NOTE — Op Note (Signed)
   NAME:  Terry [name], Terry [name]                           ACCOUNT NO.:  [id number]   MEDICAL RECORD NO.:  [id number]                   PATIENT TYPE:  AMB   LOCATION:  ENDO                                 FACILITY:  [facility]   PHYSICIAN:  Graylin Shiver, M.D.                DATE OF BIRTH:  Dec 02, 1923   DATE OF PROCEDURE:  06/26/2003  DATE OF DISCHARGE:                                 OPERATIVE REPORT   PROCEDURE:  Colonoscopy.   INDICATION FOR PROCEDURE:  Screening.   Informed consent was obtained after explanation of the risks of bleeding,  infection, and perforation.   PREMEDICATION:  Fentanyl 50 mcg IV, Versed 4 mg IV.   DESCRIPTION OF PROCEDURE:  With the patient in the left lateral decubitus  position, a rectal exam was performed and no masses were felt.  The Olympus  colonoscope was inserted into the rectum and advanced around the colon to  the cecum.  Cecal landmarks were identified.  The cecum and ascending colon  were normal.  The transverse colon was normal.  The descending colon and  sigmoid revealed diverticulosis.  These were most extensive in the sigmoid  region.  The rectum was normal.  He tolerated the procedure well without  complications.   IMPRESSION:  Diverticulosis.                                               Graylin Shiver, M.D.    Germain Osgood  D:  06/26/2003  T:  06/26/2003  Job:  [id number]   cc:   Gaynelle Cage, MD  [phone] [address]  [place]  [place] [address]  Fax: [phone]

## 2011-08-06 LAB — BASIC METABOLIC PANEL
BUN: 25 — ABNORMAL HIGH
BUN: 25 — ABNORMAL HIGH
BUN: 27 — ABNORMAL HIGH
CO2: 31
CO2: 33 — ABNORMAL HIGH
CO2: 34 — ABNORMAL HIGH
Calcium: 8.5
Calcium: 8.8
Calcium: 8.9
Chloride: 94 — ABNORMAL LOW
Chloride: 94 — ABNORMAL LOW
Chloride: 97
Creatinine, Ser: 0.92
Creatinine, Ser: 0.98
Creatinine, Ser: 1.1
GFR calc Af Amer: >60
GFR calc Af Amer: >60
GFR calc Af Amer: >60
GFR calc non Af Amer: >60
GFR calc non Af Amer: >60
GFR calc non Af Amer: >60
Glucose, Bld: 102 — ABNORMAL HIGH
Glucose, Bld: 80
Glucose, Bld: 98
Potassium: 3.9
Potassium: 4.5
Potassium: 5
Sodium: 134 — ABNORMAL LOW
Sodium: 134 — ABNORMAL LOW
Sodium: 136

## 2011-08-06 LAB — PROTIME-INR
INR: 1.1
INR: 1.2
INR: 1.7 — ABNORMAL HIGH
INR: 1.9 — ABNORMAL HIGH
Prothrombin Time: 14.8
Prothrombin Time: 15.1
Prothrombin Time: 20.2 — ABNORMAL HIGH
Prothrombin Time: 22.3 — ABNORMAL HIGH

## 2011-08-06 LAB — CBC
HCT: 27.9 — ABNORMAL LOW
HCT: 29.5 — ABNORMAL LOW
Hemoglobin: 10 — ABNORMAL LOW
Hemoglobin: 9.6 — ABNORMAL LOW
MCHC: 33.9
MCHC: 34.6
MCV: 90.8
MCV: 91.8
Platelets: 128 — ABNORMAL LOW
Platelets: 169
RBC: 3.07 — ABNORMAL LOW
RBC: 3.22 — ABNORMAL LOW
RDW: 15 — ABNORMAL HIGH
RDW: 15.5 — ABNORMAL HIGH
WBC: 13 — ABNORMAL HIGH
WBC: 14.6 — ABNORMAL HIGH

## 2011-08-06 LAB — DIGOXIN LEVEL: Digoxin Level: 0.5 — ABNORMAL LOW

## 2011-08-09 LAB — BASIC METABOLIC PANEL WITH GFR
CO2: 24
Calcium: 8.6
Chloride: 107
GFR calc Af Amer: >60
Potassium: 4.6
Sodium: 137

## 2011-08-09 LAB — I-STAT EC8
Acid-base deficit: 3 — ABNORMAL HIGH
BUN: 16
Bicarbonate: 22.1
Chloride: 104
Glucose, Bld: 170 — ABNORMAL HIGH
HCT: 27 — ABNORMAL LOW
Hemoglobin: 9.2 — ABNORMAL LOW
Operator id: 285671
Potassium: 5.5 — ABNORMAL HIGH
Sodium: 136
TCO2: 23
pCO2 arterial: 40.6
pH, Arterial: 7.344 — ABNORMAL LOW

## 2011-08-09 LAB — CBC
HCT: 26.6 — ABNORMAL LOW
HCT: 27.8 — ABNORMAL LOW
HCT: 28.8 — ABNORMAL LOW
HCT: 28.8 — ABNORMAL LOW
HCT: 31.4 — ABNORMAL LOW
HCT: 40.7
HCT: 43.2
HCT: 45.1
Hemoglobin: 10.8 — ABNORMAL LOW
Hemoglobin: 13.6
Hemoglobin: 13.9
Hemoglobin: 14.8
Hemoglobin: 15.3
Hemoglobin: 9.2 — ABNORMAL LOW
Hemoglobin: 9.6 — ABNORMAL LOW
Hemoglobin: 9.7 — ABNORMAL LOW
Hemoglobin: 9.8 — ABNORMAL LOW
MCHC: 33.5
MCHC: 33.5
MCHC: 34
MCHC: 34.1
MCHC: 34.1
MCHC: 34.2
MCHC: 34.4
MCHC: 34.8
MCV: 90.5
MCV: 90.5
MCV: 90.5
MCV: 90.7
MCV: 90.7
MCV: 91.3
MCV: 91.8
MCV: 92.8
Platelets: 111 — ABNORMAL LOW
Platelets: 116 — ABNORMAL LOW
Platelets: 117 — ABNORMAL LOW
Platelets: 119 — ABNORMAL LOW
Platelets: 119 — ABNORMAL LOW
Platelets: 178
Platelets: 193
Platelets: 215
Platelets: 223
RBC: 2.94 — ABNORMAL LOW
RBC: 3.07 — ABNORMAL LOW
RBC: 3.11 — ABNORMAL LOW
RBC: 3.13 — ABNORMAL LOW
RBC: 3.47 — ABNORMAL LOW
RBC: 4.42
RBC: 4.49
RBC: 4.78
RBC: 4.94
RDW: 14.9 — ABNORMAL HIGH
RDW: 15 — ABNORMAL HIGH
RDW: 15.1 — ABNORMAL HIGH
RDW: 15.1 — ABNORMAL HIGH
RDW: 15.3 — ABNORMAL HIGH
RDW: 15.4 — ABNORMAL HIGH
RDW: 15.4 — ABNORMAL HIGH
RDW: 15.5 — ABNORMAL HIGH
RDW: 15.5 — ABNORMAL HIGH
RDW: 15.6 — ABNORMAL HIGH
WBC: 10.6 — ABNORMAL HIGH
WBC: 13.7 — ABNORMAL HIGH
WBC: 13.8 — ABNORMAL HIGH
WBC: 14.1 — ABNORMAL HIGH
WBC: 15.1 — ABNORMAL HIGH
WBC: 16.5 — ABNORMAL HIGH
WBC: 8.8
WBC: 9.1
WBC: 9.3

## 2011-08-09 LAB — I-STAT 8, (EC8 V) (CONVERTED LAB)
Acid-base deficit: 1
BUN: 14
BUN: 19
Bicarbonate: 24.3 — ABNORMAL HIGH
Bicarbonate: 26.3 — ABNORMAL HIGH
Chloride: 105
Chloride: 99
Glucose, Bld: 106 — ABNORMAL HIGH
Glucose, Bld: 192 — ABNORMAL HIGH
HCT: 31 — ABNORMAL LOW
HCT: 47
Hemoglobin: 10.5 — ABNORMAL LOW
Hemoglobin: 16
Operator id: 265961
Operator id: 288831
Potassium: 4.5
Potassium: 4.6
Sodium: 136
Sodium: 139
TCO2: 26
TCO2: 28
pCO2, Ven: 44.6 — ABNORMAL LOW
pCO2, Ven: 46.8
pH, Ven: 7.345 — ABNORMAL HIGH
pH, Ven: 7.357 — ABNORMAL HIGH

## 2011-08-09 LAB — COMPREHENSIVE METABOLIC PANEL
ALT: 35
AST: 51 — ABNORMAL HIGH
Albumin: 3.3 — ABNORMAL LOW
Alkaline Phosphatase: 74
BUN: 12
BUN: 19
CO2: 29
Calcium: 8.8
Calcium: 9.2
Chloride: 101
Creatinine, Ser: 1.03
Creatinine, Ser: 1.14
GFR calc Af Amer: >60
GFR calc non Af Amer: >60
Glucose, Bld: 100 — ABNORMAL HIGH
Glucose, Bld: 112 — ABNORMAL HIGH
Potassium: 6 — ABNORMAL HIGH
Sodium: 138
Total Bilirubin: 1.7 — ABNORMAL HIGH
Total Protein: 6.8
Total Protein: 7.9

## 2011-08-09 LAB — POCT I-STAT 4, (NA,K, GLUC, HGB,HCT)
Glucose, Bld: 103 — ABNORMAL HIGH
Glucose, Bld: 104 — ABNORMAL HIGH
Glucose, Bld: 110 — ABNORMAL HIGH
Glucose, Bld: 116 — ABNORMAL HIGH
Glucose, Bld: 116 — ABNORMAL HIGH
Glucose, Bld: 125 — ABNORMAL HIGH
Glucose, Bld: 134 — ABNORMAL HIGH
Glucose, Bld: 137 — ABNORMAL HIGH
Glucose, Bld: 137 — ABNORMAL HIGH
Glucose, Bld: 137 — ABNORMAL HIGH
Glucose, Bld: 138 — ABNORMAL HIGH
Glucose, Bld: 139 — ABNORMAL HIGH
Glucose, Bld: 143 — ABNORMAL HIGH
Glucose, Bld: 165 — ABNORMAL HIGH
HCT: 23 — ABNORMAL LOW
HCT: 24 — ABNORMAL LOW
HCT: 24 — ABNORMAL LOW
HCT: 26 — ABNORMAL LOW
HCT: 26 — ABNORMAL LOW
HCT: 28 — ABNORMAL LOW
HCT: 29 — ABNORMAL LOW
HCT: 30 — ABNORMAL LOW
HCT: 31 — ABNORMAL LOW
HCT: 31 — ABNORMAL LOW
HCT: 32 — ABNORMAL LOW
HCT: 33 — ABNORMAL LOW
HCT: 38 — ABNORMAL LOW
HCT: 40
Hemoglobin: 10.2 — ABNORMAL LOW
Hemoglobin: 10.5 — ABNORMAL LOW
Hemoglobin: 10.5 — ABNORMAL LOW
Hemoglobin: 10.9 — ABNORMAL LOW
Hemoglobin: 11.2 — ABNORMAL LOW
Hemoglobin: 12.9 — ABNORMAL LOW
Hemoglobin: 13.6
Hemoglobin: 7.8 — CL
Hemoglobin: 8.2 — ABNORMAL LOW
Hemoglobin: 8.2 — ABNORMAL LOW
Hemoglobin: 8.8 — ABNORMAL LOW
Hemoglobin: 8.8 — ABNORMAL LOW
Hemoglobin: 9.5 — ABNORMAL LOW
Hemoglobin: 9.9 — ABNORMAL LOW
Operator id: 180381
Operator id: 3291
Operator id: 3291
Operator id: 3291
Operator id: 3291
Operator id: 3291
Operator id: 3291
Operator id: 3291
Operator id: 3291
Operator id: 3291
Operator id: 3291
Operator id: 3291
Operator id: 3291
Operator id: 3291
Potassium: 3.9
Potassium: 3.9
Potassium: 4
Potassium: 4.3
Potassium: 4.4
Potassium: 4.4
Potassium: 4.4
Potassium: 4.5
Potassium: 4.6
Potassium: 4.6
Potassium: 4.7
Potassium: 4.7
Potassium: 4.7
Potassium: 4.8
Sodium: 132 — ABNORMAL LOW
Sodium: 133 — ABNORMAL LOW
Sodium: 135
Sodium: 135
Sodium: 135
Sodium: 135
Sodium: 135
Sodium: 136
Sodium: 136
Sodium: 137
Sodium: 137
Sodium: 137
Sodium: 138
Sodium: 138

## 2011-08-09 LAB — POCT I-STAT 3, ART BLOOD GAS (G3+)
Acid-Base Excess: 1
Acid-Base Excess: 2
Acid-Base Excess: 3 — ABNORMAL HIGH
Acid-base deficit: 1
Acid-base deficit: 3 — ABNORMAL HIGH
Acid-base deficit: 4 — ABNORMAL HIGH
Bicarbonate: 20.8
Bicarbonate: 22.4
Bicarbonate: 24.5 — ABNORMAL HIGH
Bicarbonate: 26.3 — ABNORMAL HIGH
Bicarbonate: 27.3 — ABNORMAL HIGH
Bicarbonate: 30 — ABNORMAL HIGH
O2 Saturation: 100
O2 Saturation: 100
O2 Saturation: 94
O2 Saturation: 94
O2 Saturation: 96
O2 Saturation: 99
Operator id: 180381
Operator id: 285671
Operator id: 285671
Operator id: 3291
Operator id: 3291
Operator id: 3291
Patient temperature: 36.5
Patient temperature: 37.1
Patient temperature: 37.1
TCO2: 22
TCO2: 24
TCO2: 26
TCO2: 28
TCO2: 29
TCO2: 32
pCO2 arterial: 35.8
pCO2 arterial: 39.1
pCO2 arterial: 41.9
pCO2 arterial: 42.1
pCO2 arterial: 43.6
pCO2 arterial: 66.9
pH, Arterial: 7.26 — ABNORMAL LOW
pH, Arterial: 7.356
pH, Arterial: 7.367
pH, Arterial: 7.374
pH, Arterial: 7.405
pH, Arterial: 7.42
pO2, Arterial: 188 — ABNORMAL HIGH
pO2, Arterial: 254 — ABNORMAL HIGH
pO2, Arterial: 340 — ABNORMAL HIGH
pO2, Arterial: 72 — ABNORMAL LOW
pO2, Arterial: 73 — ABNORMAL LOW
pO2, Arterial: 85

## 2011-08-09 LAB — CROSSMATCH
ABO/RH(D): A NEG
Antibody Screen: NEGATIVE

## 2011-08-09 LAB — CK TOTAL AND CKMB (NOT AT ARMC)
CK, MB: 3.8
Relative Index: INVALID
Total CK: 59

## 2011-08-09 LAB — PREPARE FRESH FROZEN PLASMA

## 2011-08-09 LAB — HEPARIN LEVEL (UNFRACTIONATED)
Heparin Unfractionated: 0.27 — ABNORMAL LOW
Heparin Unfractionated: 0.34
Heparin Unfractionated: 0.71 — ABNORMAL HIGH
Heparin Unfractionated: 0.72 — ABNORMAL HIGH

## 2011-08-09 LAB — LIPID PANEL
Cholesterol: 154
HDL: 52
LDL Cholesterol: 89
Total CHOL/HDL Ratio: 3
Triglycerides: 65

## 2011-08-09 LAB — COMPREHENSIVE METABOLIC PANEL WITH GFR
ALT: 29
AST: 21
Albumin: 3.5
Alkaline Phosphatase: 62
CO2: 27
Chloride: 103
GFR calc Af Amer: >60
GFR calc non Af Amer: >60
Potassium: 4.7
Sodium: 138
Total Bilirubin: 1

## 2011-08-09 LAB — BASIC METABOLIC PANEL
BUN: 13
BUN: 16
BUN: 17
BUN: 18
CO2: 25
CO2: 29
Calcium: 8.5
Calcium: 8.6
Chloride: 104
Chloride: 108
Chloride: 99
Creatinine, Ser: 0.97
Creatinine, Ser: 1.04
Creatinine, Ser: 1.14
Creatinine, Ser: 1.19
GFR calc Af Amer: >60
GFR calc Af Amer: >60
GFR calc non Af Amer: 59 — ABNORMAL LOW
GFR calc non Af Amer: >60
GFR calc non Af Amer: >60
Glucose, Bld: 108 — ABNORMAL HIGH
Glucose, Bld: 119 — ABNORMAL HIGH
Glucose, Bld: 121 — ABNORMAL HIGH
Glucose, Bld: 158 — ABNORMAL HIGH
Potassium: 4
Potassium: 4.4
Sodium: 135
Sodium: 136

## 2011-08-09 LAB — URINALYSIS, ROUTINE W REFLEX MICROSCOPIC
Bilirubin Urine: NEGATIVE
Glucose, UA: NEGATIVE
Hgb urine dipstick: NEGATIVE
Ketones, ur: NEGATIVE
Nitrite: NEGATIVE
Protein, ur: NEGATIVE
Specific Gravity, Urine: 1.014
Urobilinogen, UA: 1
pH: 6.5

## 2011-08-09 LAB — HEMOGLOBIN A1C
Hgb A1c MFr Bld: 6.2 — ABNORMAL HIGH
Mean Plasma Glucose: 143

## 2011-08-09 LAB — BLOOD GAS, ARTERIAL
Acid-Base Excess: 0.3
Bicarbonate: 24
FIO2: 0.21
O2 Saturation: 96.8
Patient temperature: 98.6
TCO2: 25.1
pCO2 arterial: 36.3
pH, Arterial: 7.436
pO2, Arterial: 83.4

## 2011-08-09 LAB — APTT: aPTT: 41 — ABNORMAL HIGH

## 2011-08-09 LAB — POCT I-STAT 3, VENOUS BLOOD GAS (G3P V)
Bicarbonate: 28 — ABNORMAL HIGH
O2 Saturation: 78
Operator id: 3291
TCO2: 30
pCO2, Ven: 62.4 — ABNORMAL HIGH
pH, Ven: 7.26
pO2, Ven: 50 — ABNORMAL HIGH

## 2011-08-09 LAB — PREPARE PLATELET PHERESIS

## 2011-08-09 LAB — URINE MICROSCOPIC-ADD ON

## 2011-08-09 LAB — CREATININE, SERUM
Creatinine, Ser: 0.95
Creatinine, Ser: 1.31
GFR calc Af Amer: >60
GFR calc Af Amer: >60
GFR calc non Af Amer: 53 — ABNORMAL LOW
GFR calc non Af Amer: >60

## 2011-08-09 LAB — DIFFERENTIAL
Basophils Absolute: 0
Basophils Relative: 0
Eosinophils Absolute: 0
Eosinophils Relative: 0
Lymphocytes Relative: 16
Lymphs Abs: 1.7
Monocytes Absolute: 0.7
Monocytes Relative: 6
Neutro Abs: 8.2 — ABNORMAL HIGH
Neutrophils Relative %: 77

## 2011-08-09 LAB — MAGNESIUM
Magnesium: 1.9
Magnesium: 2.1
Magnesium: 2.2
Magnesium: 2.2

## 2011-08-09 LAB — ABO/RH: ABO/RH(D): A NEG

## 2011-08-09 LAB — TSH: TSH: 1.687

## 2011-08-09 LAB — TROPONIN I: Troponin I: 0.03

## 2011-08-09 LAB — PROTIME-INR
INR: 1.2
INR: 1.4
Prothrombin Time: 15.2
Prothrombin Time: 17.4 — ABNORMAL HIGH

## 2011-08-09 LAB — PLATELET COUNT: Platelets: 109 — ABNORMAL LOW

## 2011-08-09 LAB — POCT I-STAT CREATININE
Creatinine, Ser: 1.1
Operator id: 288831

## 2011-08-09 LAB — HEMOGLOBIN AND HEMATOCRIT, BLOOD
HCT: 25.4 — ABNORMAL LOW
Hemoglobin: 8.7 — ABNORMAL LOW

## 2013-12-18 ENCOUNTER — Ambulatory Visit: Payer: Self-pay | Admitting: Interventional Cardiology

## 2013-12-27 ENCOUNTER — Ambulatory Visit: Payer: Self-pay | Admitting: Interventional Cardiology

## 2013-12-28 ENCOUNTER — Encounter: Payer: Self-pay | Admitting: Cardiology

## 2013-12-28 ENCOUNTER — Encounter (INDEPENDENT_AMBULATORY_CARE_PROVIDER_SITE_OTHER): Payer: Self-pay

## 2013-12-28 ENCOUNTER — Encounter: Payer: Self-pay | Admitting: Interventional Cardiology

## 2013-12-28 ENCOUNTER — Ambulatory Visit (INDEPENDENT_AMBULATORY_CARE_PROVIDER_SITE_OTHER): Payer: Medicare Other | Admitting: Interventional Cardiology

## 2013-12-28 VITALS — BP 120/70 | HR 66 | Ht 74.0 in | Wt 223.0 lb

## 2013-12-28 DIAGNOSIS — I2510 Atherosclerotic heart disease of native coronary artery without angina pectoris: Secondary | ICD-10-CM

## 2013-12-28 DIAGNOSIS — E782 Mixed hyperlipidemia: Secondary | ICD-10-CM

## 2013-12-28 DIAGNOSIS — I10 Essential (primary) hypertension: Secondary | ICD-10-CM

## 2013-12-28 HISTORY — DX: Atherosclerotic heart disease of native coronary artery without angina pectoris: I25.10

## 2013-12-28 HISTORY — DX: Essential (primary) hypertension: I10

## 2013-12-28 NOTE — Progress Notes (Signed)
Patient ID: Terry [name], male   DOB: 04/15/1924, 80 y.o.   MRN: [id number]    [place], [place], [place]  [address] Phone: [phone] Fax:  [phone]  Date:  12/28/2013   ID:  Terry [name], DOB [birth year], MRN [id number]  PCP:  No primary provider on file.      History of Present Illness: Terry [name] is a 80 y.o. male who has CAD. CAD/ASCVD:  walks 6x/week. Denies : Chest pain.  Diaphoresis.  Dyspnea on exertion.  Leg edema.  Nitroglycerin.  Orthopnea.  Palpitations.  Paroxysmal nocturnal dyspnea.  Syncope.     Wt Readings from Last 3 Encounters:  12/28/13 223 lb (101.152 kg)     Past Medical History  Diagnosis Date  . Coronary artery disease   . Hypertension   . Arrhythmia     atrial fib  . Low back syndrome     Dr. Nelva Bush    Current Outpatient Prescriptions  Medication Sig Dispense Refill  . aspirin 81 MG tablet Take 81 mg by mouth daily.      . hydrochlorothiazide (HYDRODIURIL) 25 MG tablet Take 25 mg by mouth daily.      Marland Kitchen lisinopril (PRINIVIL,ZESTRIL) 10 MG tablet Take 10 mg by mouth daily.      . metoprolol succinate (TOPROL-XL) 25 MG 24 hr tablet Take 25 mg by mouth daily.      Marland Kitchen omega-3 acid ethyl esters (LOVAZA) 1 G capsule Take by mouth 2 (two) times daily.      . Omega-3 Fatty Acids (FISH OIL) 1000 MG CAPS Take 1,000 mg by mouth 2 (two) times daily.      . simvastatin (ZOCOR) 20 MG tablet Take 20 mg by mouth daily.      . temazepam (RESTORIL) 30 MG capsule Take 30 mg by mouth at bedtime as needed for sleep.       No current facility-administered medications for this visit.    Allergies:   No Known Allergies  Social History:  The patient  reports that he has quit smoking. He does not have any smokeless tobacco history on file.   Family History:  The patient's family history is not on file.   ROS:  Please see the history of present illness.  No nausea, vomiting.  No fevers, chills.  No focal weakness.  No dysuria. Recent  fall on the ice. He needed stitches in his left elbow.   All other systems reviewed and negative.   PHYSICAL EXAM: VS:  BP 120/70  Pulse 66  Ht 6\' 2"  (1.88 m)  Wt 223 lb (101.152 kg)  BMI 28.62 kg/m2 Well nourished, well developed, in no acute distress HEENT: normal Neck: no JVD, no carotid bruits Cardiac:  normal S1, S2; RRR;  Lungs:  clear to auscultation bilaterally, no wheezing, rhonchi or rales Abd: soft, nontender, no hepatomegaly Ext: no edema Skin: warm and dry Neuro:   no focal abnormalities noted  EKG:  NSR, prolonged PR, RBBB     ASSESSMENT AND PLAN:  Coronary Artery Disease  Continue Aspirin Tablet, 81 MG, 1 tablet, Orally, Once a day IMAGING: EKG   Harward,Amy 12/19/2012 02:20:42 PM > Camesha Farooq,JAY 12/19/2012 02:41:07 PM > NSR RBBB   Notes: No angina. continue regular exercise.  2. Hypertension, essential  Refill Hydrochlorothiazide Tablet, 25 MG, 1 tablet, Orally, one every morning as needed., 30 days, 30, Refills 11 Notes: BP controlled. HCTZ had been used only because of swelling in the past,  which has resolved.  3. Mixed hyperlipidemia  Continue Simvastatin Tablet, 20 MG, 1 tablet every evening, Orally, Once a day Continue Lovaza Capsule, 1 GM, 1 capsules, Orally, Twice a day for cholesterol Notes: Well controlled. LDL 81 in October 2012 at the [place]. LDL 93 in 11/13. He'll be having labs done at the [place] in June. He'll let us know the results.   Signed, Mina Marble, MD, [facility] 12/28/2013 11:39 AM

## 2013-12-28 NOTE — Patient Instructions (Signed)
Your physician recommends that you continue on your current medications as directed. Please refer to the Current Medication list given to you today.  Your physician wants you to follow-up in: 1 year with Dr. Varanasi. You will receive a reminder letter in the mail two months in advance. If you don't receive a letter, please call our office to schedule the follow-up appointment.  

## 2014-01-10 ENCOUNTER — Other Ambulatory Visit: Payer: Self-pay | Admitting: Interventional Cardiology

## 2014-02-07 ENCOUNTER — Telehealth: Payer: Self-pay | Admitting: Cardiology

## 2014-02-07 MED ORDER — LISINOPRIL 20 MG PO TABS: ORAL_TABLET | ORAL | Status: DC

## 2014-02-07 NOTE — Telephone Encounter (Signed)
Terry [name] pts daughter called stating pts systolic BP has been running in the 190's. Per Dr. Irish Lack increase Lisinopril to 20 mg daily. Pts daughter aware.

## 2014-02-18 ENCOUNTER — Telehealth: Payer: Self-pay | Admitting: Interventional Cardiology

## 2014-02-18 NOTE — Telephone Encounter (Signed)
Spoke with pts daughter and pts BP is running from 150-200/70-80's. Pt was drinking caffeininated drinks and since Saturday he switched back to decaf. Pt c/o of lightheadedness, weakness and fatigue at times. Pts daughter states pt doesnt usually complain. Pt is taking Lisinopril 20 mg daily and Toprol XL 25 mg daily. He isnt using HCTZ as this is only to be used if he has swelling and pt hasnt had problems with swelling. Please advise on if pt needs app or med change.

## 2014-02-18 NOTE — Telephone Encounter (Signed)
New message    Daughter [name] calling need to discuss Father blood pressure.

## 2014-02-19 MED ORDER — AMLODIPINE BESYLATE 5 MG PO TABS: 5.0000 mg | ORAL_TABLET | Freq: Every day | ORAL | Status: DC

## 2014-02-19 NOTE — Telephone Encounter (Signed)
Pts daughter notified, rx sent in.

## 2014-02-19 NOTE — Telephone Encounter (Signed)
Start amlodipine 5 mg daily.  Continue to check blood pressures at home. HCTZ is fairly weak and I don't think would get his blood pressure down from the 180, 190 range. Increasing lisinopril is another option but I think it is likely that he'll need another agent.

## 2014-03-19 ENCOUNTER — Telehealth: Payer: Self-pay | Admitting: Interventional Cardiology

## 2014-03-19 MED ORDER — AMLODIPINE BESYLATE 10 MG PO TABS: ORAL_TABLET | ORAL | Status: DC

## 2014-03-19 NOTE — Telephone Encounter (Signed)
pts daughter aware, new rx sent in.

## 2014-03-19 NOTE — Telephone Encounter (Signed)
Spoke with pts daughter [name] and pts BP is running 150's-180's/60-80's. Pt is feeling okay but he gets tired really easily. Pt does get winded with exertion at times. Pt doesn't like standing very much anymore.

## 2014-03-19 NOTE — Telephone Encounter (Signed)
New message    Blood pressure is still high. Please advise

## 2014-03-19 NOTE — Telephone Encounter (Signed)
Per Dr. Irish Lack increase Amlodipine to 10 mg daily. Lmtrc

## 2014-05-04 ENCOUNTER — Other Ambulatory Visit: Payer: Self-pay | Admitting: Interventional Cardiology

## 2014-05-06 ENCOUNTER — Telehealth: Payer: Self-pay | Admitting: Interventional Cardiology

## 2014-05-06 DIAGNOSIS — Z79899 Other long term (current) drug therapy: Secondary | ICD-10-CM

## 2014-05-06 MED ORDER — AMLODIPINE BESYLATE 5 MG PO TABS: ORAL_TABLET | ORAL | Status: DC

## 2014-05-06 MED ORDER — LISINOPRIL 40 MG PO TABS: ORAL_TABLET | ORAL | Status: DC

## 2014-05-06 MED ORDER — HYDROCHLOROTHIAZIDE 25 MG PO TABS: 25.0000 mg | ORAL_TABLET | Freq: Every day | ORAL | Status: DC

## 2014-05-06 NOTE — Telephone Encounter (Addendum)
Spoke with pts daughter and pt has had LEE for the past month. He has tried to elevate legs as much as possible. Pt started taking HCTZ 25 mg on Saturday daily instead of prn. He will continue daily HCTZ. Pts son will be seen on July 29 and pts daughter requests for pt to be worked in that day. Appt made for July 29th (worked pt in).

## 2014-05-06 NOTE — Telephone Encounter (Signed)
New problem    Pt's daughter need to speak to nurse concerning leg swelling.

## 2014-05-06 NOTE — Telephone Encounter (Signed)
Decrease amlodipine to 5 mg daily.  Increase lisinopril to 40 mg daily.  BMet in one week.  Messaged [name] but he will expect your call.

## 2014-05-06 NOTE — Telephone Encounter (Addendum)
Pts daughter notified. Pt will come for Bmet this Friday as he will be at the beach all next week. [name] pts daughter will let her Dad know.

## 2014-05-10 ENCOUNTER — Other Ambulatory Visit (INDEPENDENT_AMBULATORY_CARE_PROVIDER_SITE_OTHER): Payer: Medicare Other

## 2014-05-10 DIAGNOSIS — Z79899 Other long term (current) drug therapy: Secondary | ICD-10-CM

## 2014-05-10 LAB — BASIC METABOLIC PANEL
BUN: 26 mg/dL — ABNORMAL HIGH (ref 6–23)
CO2: 30 meq/L (ref 19–32)
CREATININE: 1.1 mg/dL (ref 0.4–1.5)
Calcium: 9.1 mg/dL (ref 8.4–10.5)
Chloride: 102 mEq/L (ref 96–112)
GFR: 67.12 mL/min (ref >60.00)
Glucose, Bld: 88 mg/dL (ref 70–99)
Potassium: 4.3 mEq/L (ref 3.5–5.1)
Sodium: 137 mEq/L (ref 135–145)

## 2014-05-22 ENCOUNTER — Ambulatory Visit (INDEPENDENT_AMBULATORY_CARE_PROVIDER_SITE_OTHER): Payer: Medicare Other | Admitting: Interventional Cardiology

## 2014-05-22 ENCOUNTER — Encounter: Payer: Self-pay | Admitting: Interventional Cardiology

## 2014-05-22 ENCOUNTER — Ambulatory Visit: Payer: Medicare Other | Admitting: Interventional Cardiology

## 2014-05-22 VITALS — BP 114/56 | HR 74 | Ht 73.5 in | Wt 216.0 lb

## 2014-05-22 DIAGNOSIS — R609 Edema, unspecified: Secondary | ICD-10-CM | POA: Insufficient documentation

## 2014-05-22 DIAGNOSIS — I10 Essential (primary) hypertension: Secondary | ICD-10-CM

## 2014-05-22 DIAGNOSIS — I2510 Atherosclerotic heart disease of native coronary artery without angina pectoris: Secondary | ICD-10-CM

## 2014-05-22 DIAGNOSIS — E782 Mixed hyperlipidemia: Secondary | ICD-10-CM

## 2014-05-22 HISTORY — DX: Edema, unspecified: R60.9

## 2014-05-22 NOTE — Progress Notes (Signed)
Patient ID: Terry [name], male   DOB: 27-Jul-1924, 80 y.o.   MRN: [id number]    [place], [place], [place]  [address] Phone: [phone] Fax:  [phone]  Date:  05/22/2014   ID:  Terry [name], DOB [birth year], MRN [id number]  PCP:  No primary provider on file.      History of Present Illness: Terry [name] is a 80 y.o. male who has CAD. CAD/ASCVD:  walks 6x/week. Denies : Chest pain.  Diaphoresis.  Dyspnea on exertion.   Nitroglycerin.  Orthopnea.  Palpitations.  Paroxysmal nocturnal dyspnea.  Syncope.  Blood pressures improved since increasing lisinopril. Edema partly related to amlodipine but also to salt intake. Edema: In the mornings.   Wt Readings from Last 3 Encounters:  05/22/14 216 lb (97.977 kg)  12/28/13 223 lb (101.152 kg)     Past Medical History  Diagnosis Date  . Coronary artery disease   . Hypertension   . Arrhythmia     atrial fib  . Low back syndrome     Dr. Nelva [name]    Current Outpatient Prescriptions  Medication Sig Dispense Refill  . amLODipine (NORVASC) 5 MG tablet 1 tablet daily  30 tablet  11  . aspirin 81 MG tablet Take 81 mg by mouth daily.      . hydrochlorothiazide (HYDRODIURIL) 25 MG tablet Take 1 tablet (25 mg total) by mouth daily.  30 tablet  11  . lisinopril (PRINIVIL,ZESTRIL) 40 MG tablet 1 tablet by mouth daily.  30 tablet  11  . metoprolol succinate (TOPROL-XL) 25 MG 24 hr tablet Take 25 mg by mouth daily.      . Omega-3 Fatty Acids (FISH OIL) 1000 MG CAPS Take 1,000 mg by mouth 2 (two) times daily. Gets from the [place].      . simvastatin (ZOCOR) 20 MG tablet Take 20 mg by mouth daily.      . temazepam (RESTORIL) 30 MG capsule Take 30 mg by mouth at bedtime as needed for sleep.       No current facility-administered medications for this visit.    Allergies:   No Known Allergies  Social History:  The patient  reports that he has quit smoking. He does not have any smokeless tobacco history on file.   Family  History:  The patient's family history includes Heart disease in his son.   ROS:  Please see the history of present illness.  No nausea, vomiting.  No fevers, chills.  No focal weakness.  No dysuria. Lower extremity swelling. No claudication  All other systems reviewed and negative.   PHYSICAL EXAM: VS:  BP 114/56  Pulse 74  Ht 6' 1.5" (1.867 m)  Wt 216 lb (97.977 kg)  BMI 28.11 kg/m2 Well nourished, well developed, in no acute distress HEENT: normal Neck: no JVD, no carotid bruits Cardiac:  normal S1, S2; RRR;  Lungs:  clear to auscultation bilaterally, no wheezing, rhonchi or rales Abd: soft, nontender, no hepatomegaly Ext: no edema , 1+ bilateral ankle edema-left slightly greater than right Skin: warm and dry Neuro:   no focal abnormalities noted       ASSESSMENT AND PLAN:  Coronary Artery Disease  Continue Aspirin Tablet, 81 MG, 1 tablet, Orally, Once a day IMAGING: EKG   Terry [name],Terry [name] 12/19/2012 02:20:42 PM > Terry [name],Terry [name] 12/19/2012 02:41:07 PM > NSR RBBB    Notes: No angina. continue regular exercise.  2. Hypertension, essential  Refill Hydrochlorothiazide Tablet, 25 MG, 1 tablet, Orally,  one every morning as needed., 30 days, 30, Refills 11 Notes: BP controlled. HCTZ had been used only because of swelling in the past, which has resolved.  3. Mixed hyperlipidemia  Continue Simvastatin Tablet, 20 MG, 1 tablet every evening, Orally, Once a day Continue Lovaza Capsule, 1 GM, 1 capsules, Orally, Twice a day for cholesterol Notes: Well controlled. LDL 81 in October 2012 at the [place]. LDL 93 in 11/13. He'll be having labs done at the [place]. He'll let us know the results. 4.  Edema: We discussed the multifactorial nature of this. It is tolerable. It is not painful. His blood pressure is much better controlled now on the current regimen. I am hesitant to take him off of any of the current medicine to try and treat edema. If the swelling gets worse, he'll let us know. Continue to elevate  legs at night.  Signed, Terry Marble, MD, [facility] 05/22/2014 4:37 PM

## 2014-05-22 NOTE — Patient Instructions (Signed)
Your physician recommends that you continue on your current medications as directed. Please refer to the Current Medication list given to you today.  Your physician wants you to follow-up in: 1 year with Dr. Varanasi. You will receive a reminder letter in the mail two months in advance. If you don't receive a letter, please call our office to schedule the follow-up appointment.  

## 2014-12-02 ENCOUNTER — Telehealth: Payer: Self-pay | Admitting: Interventional Cardiology

## 2014-12-02 NOTE — Telephone Encounter (Signed)
Received request from Nurse fax box, documents faxed for surgical clearance. To: [facility] [place]  Fax number: [phone] Attention: 2.8.16/km

## 2015-03-20 ENCOUNTER — Telehealth: Payer: Self-pay | Admitting: Interventional Cardiology

## 2015-03-20 NOTE — Telephone Encounter (Signed)
Daughter returned call. Discussed pt's medications and answered questions daughter had. Daughter very appreciative for assistance.

## 2015-03-20 NOTE — Telephone Encounter (Signed)
Returned daughters phone call. Unable to locate DPR. Informed daughter that I would call pt and get permission to speak with her and call back. Daughter verbalized understanding. Attempted to call daughter back, left message on voicemail to call me back.

## 2015-03-20 NOTE — Telephone Encounter (Signed)
New problem   Pt's daughter want to go over pt's medication list with nurse, b/c she thinks pt is confused concerning what he is suppose to take. Please call pt's daughter.

## 2015-04-08 ENCOUNTER — Ambulatory Visit: Payer: Medicare Other | Attending: Family Medicine | Admitting: Physical Therapy

## 2015-04-08 DIAGNOSIS — R531 Weakness: Secondary | ICD-10-CM

## 2015-04-08 NOTE — Therapy (Signed)
[facility] [place], [place], [address] Phone: [phone]   Fax:  [phone]  Physical Therapy Evaluation  Patient Details  Name: Terry [name] MRN: [id number] Date of Birth: [birth year] Referring Provider:  Alroy Dust, L.Marlou Sa, MD  Encounter Date: 04/08/2015      PT End of Session - 04/08/15 1127    Visit Number 1   Number of Visits 12   Date for PT Re-Evaluation 05/20/15   PT Start Time 1030   PT Stop Time 1058   PT Time Calculation (min) 28 min   Activity Tolerance Patient tolerated treatment well      Past Medical History  Diagnosis Date  . Coronary artery disease   . Hypertension   . Arrhythmia     atrial fib  . Low back syndrome     Dr. Nelva Bush    Past Surgical History  Procedure Laterality Date  . Coronary artery bypass graft  2008    MV REPLACEMENT  . Inguinal hernia repair Bilateral   . Right knee arthroscopy    . Lumbar laminectomy      There were no vitals filed for this visit.  Visit Diagnosis:  Weakness - Plan: PT plan of care cert/re-cert      Subjective Assessment - 04/08/15 1044    Subjective I walk everyday.   Patient Stated Goals Improve leg strength.   Multiple Pain Sites No            [facility] PT Assessment - 04/08/15 0001    Assessment   Medical Diagnosis Leg weakness.   Onset Date/Surgical Date --  Ongoing.   Precautions   Precautions --  Supervised gait.   Restrictions   Weight Bearing Restrictions No   Balance Screen   Has the patient fallen in the past 6 months Yes   How many times? 1   Has the patient had a decrease in activity level because of a fear of falling?  No   Is the patient reluctant to leave their home because of a fear of falling?  No   Home Environment   Living Environment Private residence   Prior Function   Level of Independence Independent   Posture/Postural Control   Posture/Postural Control Postural limitations   Postural Limitations Rounded  Shoulders;Forward head;Decreased lumbar lordosis   ROM / Strength   AROM / PROM / Strength AROM;Strength   AROM   AROM Assessment Site --  [facility] for bilateral LE's.   Strength   Overall Strength Comments ared.  Left foot drop and dorsiflexion= 1+ to 2-/5.   Special Tests    Special Tests --  Negative Romberg test.   Ambulation/Gait   Gait Comments Left steppage gait.                             PT Short Term Goals - 04/08/15 1136    PT SHORT TERM GOAL #1   Title Ind with initial HEP.   Time 2   Period Weeks   Status New           PT Long Term Goals - 04/08/15 1136    PT LONG TERM GOAL #1   Title Left quad/ham strength= 5/5 to increase stability for functional actiivtes.   Time 6   Period Weeks   Status New   PT LONG TERM GOAL #2   Title Patient able to perform 20 minutes of aerobic activity with 02 sat  not dropping below 92%.   Time 6   Status New               Plan - 04-26-2015 1127    Clinical Impression Statement The patient presents to outpatient physical therapy with c/o bilateral lE weakness.  He states he walks daily but his legs get tired and he wants to get stronger.  The patient's daughter was present and states he has foot drop and it is not expected to ever return.   Pt will benefit from skilled therapeutic intervention in order to improve on the following deficits Abnormal gait;Decreased activity tolerance;Decreased strength   Rehab Potential Excellent   PT Frequency 2x / week   PT Duration 6 weeks   PT Treatment/Interventions Gait training;ADLs/Self Care Home Management;Therapeutic activities;Therapeutic exercise;Balance training;Neuromuscular re-education   PT Next Visit Plan Nustep; Quad/ham machine; bilateral LE strengthening; Neuro re-edu.          G-Codes - 26-Apr-2015 1138    Functional Assessment Tool Used Clinical judgement.   Functional Limitation Mobility: Walking and moving around   Mobility: Walking and Moving  Around Current Status [phone]) At least 20 percent but less than 40 percent impaired, limited or restricted   Mobility: Walking and Moving Around Goal Status [phone]) At least 1 percent but less than 20 percent impaired, limited or restricted       Problem List Patient Active Problem List   Diagnosis Date Noted  . Edema 05/22/2014  . Coronary atherosclerosis of unspecified type of vessel, native or graft 12/28/2013  . Essential hypertension, benign 12/28/2013  . Mixed hyperlipidemia 12/28/2013    APP[place], [place] MPT 04-26-15, 11:41 AM  [facility] [address] [place], [place], [address] Phone: [phone]   Fax:  [phone]

## 2015-04-11 ENCOUNTER — Ambulatory Visit: Payer: Medicare Other | Admitting: Physical Therapy

## 2015-04-11 ENCOUNTER — Encounter: Payer: Self-pay | Admitting: Physical Therapy

## 2015-04-11 DIAGNOSIS — R531 Weakness: Secondary | ICD-10-CM

## 2015-04-11 NOTE — Patient Instructions (Signed)
Knee Extension: Resisted (Sitting)   With band looped around right ankle and under other foot, straighten leg with ankle loop. Keep other leg bent to increase resistance. Repeat _10___ times per set. Do _2-3___ sets per session. Do _2-3___ sessions per day.  http://orth.exer.us/690   Copyright  VHI. All rights reserved.  Knee Flexion: Resisted (Sitting)   Sit with band under left foot and looped around ankle of supported leg. Pull unsupported leg back. Repeat _10___ times per set. Do _2-3___ sets per session. Do __2-3__ sessions per day.  http://orth.exer.us/694   Copyright  VHI. All rights reserved.

## 2015-04-11 NOTE — Therapy (Signed)
[facility] [place], [place], [address] Phone: [phone]   Fax:  [phone]  Physical Therapy Treatment  Patient Details  Name: Terry [name] MRN: [id number] Date of Birth: [birth year] Referring Provider:  Alroy Dust, L.Marlou Sa, MD  Encounter Date: 04/11/2015      PT End of Session - 04/11/15 1033    Visit Number 2   Number of Visits 12   Date for PT Re-Evaluation 05/20/15   PT Start Time 1031   PT Stop Time 1115   PT Time Calculation (min) 44 min   Activity Tolerance Patient tolerated treatment well   Behavior During Therapy [facility] for tasks assessed/performed      Past Medical History  Diagnosis Date  . Coronary artery disease   . Hypertension   . Arrhythmia     atrial fib  . Low back syndrome     Dr. Nelva Bush    Past Surgical History  Procedure Laterality Date  . Coronary artery bypass graft  2008    MV REPLACEMENT  . Inguinal hernia repair Bilateral   . Right knee arthroscopy    . Lumbar laminectomy      There were no vitals filed for this visit.  Visit Diagnosis:  Weakness      Subjective Assessment - 04/11/15 1036    Subjective Reports no pain or issues today.   Patient Stated Goals Improve leg strength.   Currently in Pain? No/denies            [facility] PT Assessment - 04/11/15 0001    Assessment   Medical Diagnosis Leg weakness.                     [facility] Adult PT Treatment/Exercise - 04/11/15 0001    Exercises   Exercises Knee/Hip;Ankle   Knee/Hip Exercises: Aerobic   Stationary Bike NuStep L5 x12 min   Knee/Hip Exercises: Standing   Lateral Step Up Both;3 sets;10 reps;Step Height: 6"   Forward Step Up Both;3 sets;10 reps;Step Height: 6"   Other Standing Knee Exercises Marching on airex x30 reps   Other Standing Knee Exercises B hip abduction yellow theraband x30 reps   Knee/Hip Exercises: Seated   Other Seated Knee Exercises HS and Quad strengthening in sitting yellow  theraband x30 reps each   Knee/Hip Exercises: Machines for Strengthening   Cybex Knee Extension 10# 3 sets 10 reps   Cybex Knee Flexion 30# 3 sets 10 reps                PT Education - 04/11/15 1037    Education provided Yes   Education Details HEP- hamstring and quad strengthening in sitting with yellow theraband   Person(s) Educated Patient;Caregiver(s)   Methods Explanation;Demonstration;Verbal cues;Handout   Comprehension Verbalized understanding;Returned demonstration;Verbal cues required          PT Short Term Goals - 04/11/15 1033    PT SHORT TERM GOAL #1   Title Ind with initial HEP.   Time 2   Period Weeks   Status On-going           PT Long Term Goals - 04/11/15 1033    PT LONG TERM GOAL #1   Title Left quad/ham strength= 5/5 to increase stability for functional actiivtes.   Time 6   Period Weeks   Status On-going   PT LONG TERM GOAL #2   Title Patient able to perform 20 minutes of aerobic activity with 02 sat not dropping below 92%.  Time 6   Period Weeks   Status On-going               Plan - 04/11/15 1121    Clinical Impression Statement Patient did very well during therapy with exercises without complaint of pain. Required moderate verbal cueing with demonstration in order to complete exercises with proper technique. Patient and son-in-law accepted new HEP with yellow theraband for use at home. Denied pain or shortness of breath following treatment.    Pt will benefit from skilled therapeutic intervention in order to improve on the following deficits Abnormal gait;Decreased activity tolerance;Decreased strength   Rehab Potential Excellent   PT Frequency 2x / week   PT Duration 6 weeks   PT Treatment/Interventions Gait training;ADLs/Self Care Home Management;Therapeutic activities;Therapeutic exercise;Balance training;Neuromuscular re-education   PT Next Visit Plan Continue per MPT POC.   Consulted and Agree with Plan of Care  Patient;Family member/caregiver   Family Member Consulted Son-in-law        Problem List Patient Active Problem List   Diagnosis Date Noted  . Edema 05/22/2014  . Coronary atherosclerosis of unspecified type of vessel, native or graft 12/28/2013  . Essential hypertension, benign 12/28/2013  . Mixed hyperlipidemia 12/28/2013    Wynelle Fanny, PTA 04/11/2015, 11:23 AM  [facility] [address] [place], [place], [address] Phone: [phone]   Fax:  [phone]

## 2015-04-15 ENCOUNTER — Encounter: Payer: Self-pay | Admitting: Physical Therapy

## 2015-04-15 ENCOUNTER — Ambulatory Visit: Payer: Medicare Other | Admitting: Physical Therapy

## 2015-04-15 DIAGNOSIS — R531 Weakness: Secondary | ICD-10-CM | POA: Diagnosis not present

## 2015-04-15 NOTE — Therapy (Signed)
[facility] [place], [place], [address] Phone: [phone]   Fax:  [phone]  Physical Therapy Treatment  Patient Details  Name: Terry [name] MRN: [id number] Date of Birth: May 15, 1924 Referring Provider:  Alroy Dust, L.Marlou Sa, MD  Encounter Date: 04/15/2015      PT End of Session - 04/15/15 1039    Visit Number 3   Number of Visits 12   Date for PT Re-Evaluation 05/20/15   PT Start Time 1031   PT Stop Time 1115   PT Time Calculation (min) 44 min   Activity Tolerance Patient tolerated treatment well   Behavior During Therapy [facility] for tasks assessed/performed      Past Medical History  Diagnosis Date  . Coronary artery disease   . Hypertension   . Arrhythmia     atrial fib  . Low back syndrome     Dr. Nelva Bush    Past Surgical History  Procedure Laterality Date  . Coronary artery bypass graft  2008    MV REPLACEMENT  . Inguinal hernia repair Bilateral   . Right knee arthroscopy    . Lumbar laminectomy      There were no vitals filed for this visit.  Visit Diagnosis:  Weakness      Subjective Assessment - 04/15/15 1039    Subjective Reports that he felt good after last treatment.    Patient Stated Goals Improve leg strength.   Currently in Pain? No/denies            [facility] PT Assessment - 04/15/15 0001    Assessment   Medical Diagnosis Leg weakness.                     [facility] Adult PT Treatment/Exercise - 04/15/15 0001    Knee/Hip Exercises: Aerobic   Stationary Bike NuStep L4 x15 min   Knee/Hip Exercises: Machines for Strengthening   Cybex Knee Extension 10# 3 sets 10 reps   Cybex Knee Flexion 30# 3 sets 10 reps   Knee/Hip Exercises: Standing   Heel Raises Both;3 sets;10 reps   Lateral Step Up Both;3 sets;10 reps;Step Height: 6"   Forward Step Up Both;3 sets;10 reps;Step Height: 6"   Other Standing Knee Exercises BLE marching on airex x3 min   Other Standing Knee Exercises BLE  toe taps to 6" step x3 min   Knee/Hip Exercises: Seated   Long Arc Quad Strengthening;Both;3 sets;10 reps;Weights   Long Arc Quad Weight 3 lbs.   Other Seated Knee/Hip Exercises B HS curl 3# 3 x10 reps   Knee/Hip Exercises: Sidelying   Hip ABduction Strengthening;Both;3 sets;10 reps;Other (comment)  3#   Hip ADduction Strengthening;Both;3 sets;10 reps;Other (comment)  3#                  PT Short Term Goals - 04/11/15 1033    PT SHORT TERM GOAL #1   Title Ind with initial HEP.   Time 2   Period Weeks   Status On-going           PT Long Term Goals - 04/11/15 1033    PT LONG TERM GOAL #1   Title Left quad/ham strength= 5/5 to increase stability for functional actiivtes.   Time 6   Period Weeks   Status On-going   PT LONG TERM GOAL #2   Title Patient able to perform 20 minutes of aerobic activity with 02 sat not dropping below 92%.   Time 6   Period Weeks  Status On-going               Plan - 04/15/15 1123    Clinical Impression Statement Patient tolerated treatment very well with LE strengthening exercises. Tolerated increasing time on NuStep well without complaint of SOB and pain. All goal remain on-going at this time secondary to decreased strength, and decreased time on NuStep . Denied pain, SOB following therapy and only a small amount of fatigue.   Pt will benefit from skilled therapeutic intervention in order to improve on the following deficits Abnormal gait;Decreased activity tolerance;Decreased strength   Rehab Potential Excellent   PT Frequency 2x / week   PT Duration 6 weeks   PT Treatment/Interventions Gait training;ADLs/Self Care Home Management;Therapeutic activities;Therapeutic exercise;Balance training;Neuromuscular re-education   PT Next Visit Plan Continue per MPT POC. Advance to 18 min on Nustep and monitor O2.   Consulted and Agree with Plan of Care Patient;Family member/caregiver   Family Member Consulted Son-in-law         Problem List Patient Active Problem List   Diagnosis Date Noted  . Edema 05/22/2014  . Coronary atherosclerosis of unspecified type of vessel, native or graft 12/28/2013  . Essential hypertension, benign 12/28/2013  . Mixed hyperlipidemia 12/28/2013    Wynelle Fanny, PTA 04/15/2015, 11:30 AM  [facility] [address] [place], [place], [address] Phone: [phone]   Fax:  [phone]

## 2015-04-18 ENCOUNTER — Encounter: Payer: Medicare Other | Admitting: Physical Therapy

## 2015-04-22 ENCOUNTER — Encounter: Payer: Self-pay | Admitting: Physical Therapy

## 2015-04-22 ENCOUNTER — Ambulatory Visit: Payer: Medicare Other | Admitting: Physical Therapy

## 2015-04-22 DIAGNOSIS — R531 Weakness: Secondary | ICD-10-CM

## 2015-04-22 NOTE — Therapy (Signed)
[facility] [place], [place], [address] Phone: [phone]   Fax:  [phone]  Physical Therapy Treatment  Patient Details  Name: Terry [name] MRN: [id number] Date of Birth: Dec 22, 1923 Referring Provider:  Alroy Dust, L.Marlou Sa, MD  Encounter Date: 04/22/2015      PT End of Session - 04/22/15 1033    Visit Number 4   Number of Visits 12   Date for PT Re-Evaluation 05/20/15   PT Start Time 1031   PT Stop Time 1115   PT Time Calculation (min) 44 min   Activity Tolerance Patient tolerated treatment well   Behavior During Therapy [facility] for tasks assessed/performed      Past Medical History  Diagnosis Date  . Coronary artery disease   . Hypertension   . Arrhythmia     atrial fib  . Low back syndrome     Dr. Nelva Bush    Past Surgical History  Procedure Laterality Date  . Coronary artery bypass graft  2008    MV REPLACEMENT  . Inguinal hernia repair Bilateral   . Right knee arthroscopy    . Lumbar laminectomy      There were no vitals filed for this visit.  Visit Diagnosis:  Weakness      Subjective Assessment - 04/22/15 1033    Subjective Reports R ankle isn't swelling as much. Reports using stationary bike 18-20 min/ day.   Patient Stated Goals Improve leg strength.   Currently in Pain? No/denies            [facility] PT Assessment - 04/22/15 0001    Assessment   Medical Diagnosis Leg weakness.                     [facility] Adult PT Treatment/Exercise - 04/22/15 0001    Exercises   Exercises Knee/Hip;Ankle   Knee/Hip Exercises: Aerobic   Nustep L5 x18 min   Knee/Hip Exercises: Machines for Strengthening   Cybex Knee Extension 20# 3 sets 10 reps   Cybex Knee Flexion 30# 1 sets 10 reps, 40# 3 sets 10 reps   Knee/Hip Exercises: Standing   Heel Raises Both;3 sets;10 reps   Lateral Step Up Both;3 sets;10 reps;Step Height: 6"   Forward Step Up Both;3 sets;10 reps;Step Height: 6"   Rocker Board 3  minutes;Other (comment)  Lateral; intermittant UE use   Other Standing Knee Exercises BLE marching on airex x4 min   Other Standing Knee Exercises BLE toe taps to 6" step x3 min   Knee/Hip Exercises: Sidelying   Hip ABduction Strengthening;Both;3 sets;10 reps;Other (comment)  3#                PT Education - 04/22/15 1124    Education provided Yes   Education Details Encouraged patient to complete HEP given previously   Person(s) Educated Patient   Methods Explanation   Comprehension Verbalized understanding          PT Short Term Goals - 04/11/15 1033    PT SHORT TERM GOAL #1   Title Ind with initial HEP.   Time 2   Period Weeks   Status On-going           PT Long Term Goals - 04/11/15 1033    PT LONG TERM GOAL #1   Title Left quad/ham strength= 5/5 to increase stability for functional actiivtes.   Time 6   Period Weeks   Status On-going   PT LONG TERM GOAL #2  Title Patient able to perform 20 minutes of aerobic activity with 02 sat not dropping below 92%.   Time 6   Period Weeks   Status On-going               Plan - 04/22/15 1126    Clinical Impression Statement Patient tolerated treatment very well with LE strengthening and balance. Tolerated increasing time on NuStep well without complaint of SOB with 97% O2 and 91 bpm upon end of 18 min. Encouraged patient to complete HEP at home to increase gains from PT. Required tactile cueing during BLE sidelying hip abduction in order to keep hips rotated for proper technique. Denied pain following treatment.   Pt will benefit from skilled therapeutic intervention in order to improve on the following deficits Abnormal gait;Decreased activity tolerance;Decreased strength   Rehab Potential Excellent   PT Frequency 2x / week   PT Duration 6 weeks   PT Treatment/Interventions Gait training;ADLs/Self Care Home Management;Therapeutic activities;Therapeutic exercise;Balance training;Neuromuscular re-education    PT Next Visit Plan Continue per MPT POC.    Consulted and Agree with Plan of Care Patient        Problem List Patient Active Problem List   Diagnosis Date Noted  . Edema 05/22/2014  . Coronary atherosclerosis of unspecified type of vessel, native or graft 12/28/2013  . Essential hypertension, benign 12/28/2013  . Mixed hyperlipidemia 12/28/2013    Wynelle Fanny, PTA 04/22/2015, 11:37 AM  [facility] [address] [place], [place], [address] Phone: [phone]   Fax:  [phone]

## 2015-05-14 ENCOUNTER — Other Ambulatory Visit: Payer: Self-pay | Admitting: Interventional Cardiology

## 2015-06-04 ENCOUNTER — Encounter: Payer: Self-pay | Admitting: Interventional Cardiology

## 2015-06-04 ENCOUNTER — Ambulatory Visit (INDEPENDENT_AMBULATORY_CARE_PROVIDER_SITE_OTHER): Payer: Medicare Other | Admitting: Interventional Cardiology

## 2015-06-04 VITALS — BP 116/54 | HR 65 | Ht 73.5 in | Wt 222.8 lb

## 2015-06-04 DIAGNOSIS — I2510 Atherosclerotic heart disease of native coronary artery without angina pectoris: Secondary | ICD-10-CM

## 2015-06-04 DIAGNOSIS — E782 Mixed hyperlipidemia: Secondary | ICD-10-CM | POA: Diagnosis not present

## 2015-06-04 DIAGNOSIS — R609 Edema, unspecified: Secondary | ICD-10-CM

## 2015-06-04 DIAGNOSIS — E875 Hyperkalemia: Secondary | ICD-10-CM

## 2015-06-04 DIAGNOSIS — I340 Nonrheumatic mitral (valve) insufficiency: Secondary | ICD-10-CM | POA: Diagnosis not present

## 2015-06-04 DIAGNOSIS — I10 Essential (primary) hypertension: Secondary | ICD-10-CM | POA: Diagnosis not present

## 2015-06-04 HISTORY — DX: Nonrheumatic mitral (valve) insufficiency: I34.0

## 2015-06-04 NOTE — Progress Notes (Signed)
Patient ID: Terry [name], male   DOB: 1923/11/18, 81 y.o.   MRN: [id number]     Cardiology Office Note   Date:  06/04/2015   ID:  Terry [name], DOB [birth year], MRN [id number]  PCP:  Donnie Coffin, MD    No chief complaint on file.  CAD  Wt Readings from Last 3 Encounters:  06/04/15 222 lb 12.8 oz (101.061 kg)  05/22/14 216 lb (97.977 kg)  12/28/13 223 lb (101.152 kg)       History of Present Illness: Terry [name] is a 81 y.o. male  who has CAD.  Coronary artery bypass grafting x3 (left internal mammary artery to  left anterior descending, saphenous vein graft to circumflex  marginal, saphenous vein graft to posterior descending) in 2008 with Mitral valve annuloplasty repair for ischemic mitral regurgitation  with a 28 mm McCarthy-Adams ring.  Left atrial and right atrial MAZE procedure for preoperative atrial  flutter.  Stapling of left atrial appendage. CAD/ASCVD:  walks 6x/week.;  Had some left leg pain with walking.  He went to be evaluated at [facility] in [place]. Left leg pain resolved with PT.  He does an elliptical and some weights as well. Denies : Chest pain.  Diaphoresis.  DOE  Nitroglycerin.  Orthopnea.  Palpitations.  Paroxysmal nocturnal dyspnea.  Syncope.  Blood pressures controlled at home, 170Y systolic.  Lisinopril decreased recently.    Past Medical History  Diagnosis Date  . Coronary artery disease   . Hypertension   . Arrhythmia     atrial fib  . Low back syndrome     Dr. Nelva Bush    Past Surgical History  Procedure Laterality Date  . Coronary artery bypass graft  2008    MV REPLACEMENT  . Inguinal hernia repair Bilateral   . Right knee arthroscopy    . Lumbar laminectomy       Current Outpatient Prescriptions  Medication Sig Dispense Refill  . amLODipine (NORVASC) 5 MG tablet Take 5 mg by mouth daily.    Marland Kitchen aspirin 81 MG tablet Take 81 mg by mouth daily.    . hydrochlorothiazide (HYDRODIURIL) 25 MG tablet Take 25  mg by mouth daily.    Marland Kitchen lisinopril (PRINIVIL,ZESTRIL) 20 MG tablet Take 20 mg by mouth daily.    . metoprolol succinate (TOPROL-XL) 25 MG 24 hr tablet Take 25 mg by mouth daily.    . Omega-3 Fatty Acids (FISH OIL) 1000 MG CAPS Take 1,000 mg by mouth 2 (two) times daily. Gets from the [place].    . simvastatin (ZOCOR) 20 MG tablet Take 20 mg by mouth daily.    . temazepam (RESTORIL) 30 MG capsule Take 30 mg by mouth at bedtime as needed for sleep.     No current facility-administered medications for this visit.    Allergies:   Review of patient's allergies indicates no known allergies.    Social History:  The patient  reports that he has quit smoking. He does not have any smokeless tobacco history on file.   Family History:  The patient's family history includes Heart disease in his son.    ROS:  Please see the history of present illness.   Otherwise, review of systems are positive for occasional edema, left leg pain as noted above.   All other systems are reviewed and negative.    PHYSICAL EXAM: VS:  BP 116/54 mmHg  Pulse 65  Ht 6' 1.5" (1.867 m)  Wt 222 lb 12.8 oz (101.061 kg)  BMI 28.99 kg/m2 , BMI Body mass index is 28.99 kg/(m^2). GEN: Well nourished, well developed, in no acute distress HEENT: normal Neck: no JVD, carotid bruits, or masses Cardiac: RRR; no murmurs, rubs, or gallops,tr ankle edema  Respiratory:  clear to auscultation bilaterally, normal work of breathing GI: soft, nontender, nondistended, + BS MS: no deformity or atrophy Skin: warm and dry, no rash Neuro:  Strength and sensation are intact Psych: euthymic mood, full affect   EKG:   The ekg ordered today demonstrates NSR, prolonged PR interval, RBBB   Recent Labs: No results found for requested labs within last 365 days.   Lipid Panel    Component Value Date/Time   CHOL  05/30/2007 1837    154        ATP III CLASSIFICATION:  <200     mg/dL   Desirable  200-239  mg/dL   Borderline High  >=240     mg/dL   High   TRIG 65 05/30/2007 1837   HDL 52 05/30/2007 1837   CHOLHDL 3.0 05/30/2007 1837   VLDL 13 05/30/2007 1837   LDLCALC  05/30/2007 1837    89        Total Cholesterol/HDL:CHD Risk Coronary Heart Disease Risk Table                     Men   Women  1/2 Average Risk   3.4   3.3     Other studies Reviewed: Additional studies/ records that were reviewed today with results demonstrating: operative record from 2008.   ASSESSMENT AND PLAN:  1. CAD: No angina .  Continue aggressive secondary prevention.  2. HTN: BP controlled.  Continue daily HCTZ and decreased Lisinopril. 3. Hyperlipidemia: Lipids from the [place] reviewed and well controlled. 4. Hyperkalemia: K was 5.3 at the [place].  THis should be repeated at his PMD in a few weeks to se if this is still elevated.  If it is, he may need some dietary changes. 5. Mitral valve disease: No sign of CHF.    Current medicines are reviewed at length with the patient today.  The patient concerns regarding his medicines were addressed.  The following changes have been made:  No change  Labs/ tests ordered today include: BMet  Orders Placed This Encounter  Procedures  . EKG 12-Lead    Recommend 150 minutes/week of aerobic exercise Low fat, low carb, high fiber diet recommended  Disposition:   FU in 1 year   Teresita Madura., MD  06/04/2015 11:31 AM    [facility] [place], [place], [place]  [address] Phone: [phone]; Fax: [phone]

## 2015-06-04 NOTE — Patient Instructions (Signed)
Medication Instructions:  Same-No change  Labwork: None  Testing/Procedures: None  Follow-Up: Your physician wants you to follow-up in: 1 year. You will receive a reminder letter in the mail two months in advance. If you don't receive a letter, please call our office to schedule the follow-up appointment.      

## 2016-03-11 NOTE — Therapy (Signed)
[facility] [place], [place], [address] Phone: [phone]   Fax:  [phone]  Physical Therapy Treatment  Patient Details  Name: Terry [name] MRN: [id number] Date of Birth: Aug 13, 1924 No Data Recorded  Encounter Date: 04/22/2015    Past Medical History  Diagnosis Date  . Coronary artery disease   . Hypertension   . Arrhythmia     atrial fib  . Low back syndrome     Dr. Nelva Bush    Past Surgical History  Procedure Laterality Date  . Coronary artery bypass graft  2008    MV REPLACEMENT  . Inguinal hernia repair Bilateral   . Right knee arthroscopy    . Lumbar laminectomy      There were no vitals filed for this visit.                                 PT Short Term Goals - 04/11/15 1033    PT SHORT TERM GOAL #1   Title Ind with initial HEP.   Time 2   Period Weeks   Status On-going           PT Long Term Goals - 04/11/15 1033    PT LONG TERM GOAL #1   Title Left quad/ham strength= 5/5 to increase stability for functional actiivtes.   Time 6   Period Weeks   Status On-going   PT LONG TERM GOAL #2   Title Patient able to perform 20 minutes of aerobic activity with 02 sat not dropping below 92%.   Time 6   Period Weeks   Status On-going             Patient will benefit from skilled therapeutic intervention in order to improve the following deficits and impairments:  Abnormal gait, Decreased activity tolerance, Decreased strength  Visit Diagnosis: Weakness     Problem List Patient Active Problem List   Diagnosis Date Noted  . Mitral valve regurgitation, ischemic 06/04/2015  . Edema 05/22/2014  . Coronary atherosclerosis 12/28/2013  . Essential hypertension, benign 12/28/2013  . Mixed hyperlipidemia 12/28/2013  PHYSICAL THERAPY DISCHARGE SUMMARY  Visits from Start of Care:   Current functional level related to goals / functional outcomes: Please see  above.   Remaining deficits: Mild LE strength loss.   Education / Equipment: HEP.  Plan: Patient agrees to discharge.  Patient goals were not met. Patient is being discharged due to not returning since the last visit.  ?????       Rozetta Stumpp, [place] MPT 03/11/2016, 6:44 PM  [facility] [address] [place], [place], [address] Phone: [phone]   Fax:  [phone]  Name: Terry [name] MRN: [id number] Date of Birth: 11-Jan-1924

## 2016-06-06 NOTE — Progress Notes (Signed)
Patient ID: Terry [name], male   DOB: 1924-01-11, 82 y.o.   MRN: [id number]     Cardiology Office Note   Date:  06/07/2016   ID:  Terry [name], DOB 1924/07/24, MRN [id number]  PCP:  Donnie Coffin, MD    No chief complaint on file.  CAD  Wt Readings from Last 3 Encounters:  06/07/16 212 lb (96.2 kg)  06/04/15 222 lb 12.8 oz (101.1 kg)  05/22/14 216 lb (98 kg)       History of Present Illness: Terry [name] is a 82 y.o. male  who has CAD.  Coronary artery bypass grafting x3 (left internal mammary artery to  left anterior descending, saphenous vein graft to circumflex  marginal, saphenous vein graft to posterior descending) in 2008 with Mitral valve annuloplasty repair for ischemic mitral regurgitation  with a 28 mm McCarthy-Adams ring.  Left atrial and right atrial MAZE procedure for preoperative atrial  flutter.  Stapling of left atrial appendage. CAD/ASCVD:  walks 6x/week at cardiac rehab facility.;    He does an elliptical and some weights as well. Leg and core exercises Denies : Chest pain.  Diaphoresis.  DOE  Nitroglycerin.  Orthopnea.  Palpitations.  Paroxysmal nocturnal dyspnea.  Syncope.  Blood pressures controlled at home, [id number] systolic.  Lisinopril decreased in the past.  He works out at a cardiac rehab facility.  He goes several days a week, without any sx.    Past Medical History:  Diagnosis Date  . Arrhythmia    atrial fib  . Coronary artery disease   . Hypertension   . Low back syndrome    Dr. Nelva Bush    Past Surgical History:  Procedure Laterality Date  . CORONARY ARTERY BYPASS GRAFT  2008   MV REPLACEMENT  . INGUINAL HERNIA REPAIR Bilateral   . LUMBAR LAMINECTOMY    . RIGHT KNEE ARTHROSCOPY       Current Outpatient Prescriptions  Medication Sig Dispense Refill  . amLODipine (NORVASC) 5 MG tablet Take 5 mg by mouth daily.    Marland Kitchen aspirin 81 MG tablet Take 81 mg by mouth daily.    . hydrochlorothiazide (HYDRODIURIL) 25 MG  tablet Take 25 mg by mouth daily.    Marland Kitchen lisinopril (PRINIVIL,ZESTRIL) 20 MG tablet Take 20 mg by mouth daily.    . metoprolol succinate (TOPROL-XL) 25 MG 24 hr tablet Take 25 mg by mouth daily.    . Omega-3 Fatty Acids (FISH OIL) 1000 MG CAPS Take 1,000 mg by mouth 2 (two) times daily. Gets from the [place].    . simvastatin (ZOCOR) 20 MG tablet Take 20 mg by mouth daily.    . temazepam (RESTORIL) 30 MG capsule Take 30 mg by mouth at bedtime as needed for sleep.     No current facility-administered medications for this visit.     Allergies:   Review of patient's allergies indicates no known allergies.    Social History:  The patient  reports that he has quit smoking. He does not have any smokeless tobacco history on file.   Family History:  The patient's family history includes Heart attack in his brother, father, and other; Heart disease in his son.    ROS:  Please see the history of present illness.   Otherwise, review of systems are positive for occasional edema, left leg pain as noted above.   All other systems are reviewed and negative.    PHYSICAL EXAM: VS:  BP 110/60   Pulse 67  Ht 6' 1.5" (1.867 m)   Wt 212 lb (96.2 kg)   BMI 27.59 kg/m  , BMI Body mass index is 27.59 kg/m. GEN: Well nourished, well developed, in no acute distress  HEENT: normal  Neck: no JVD, carotid bruits, or masses Cardiac: RRR; no murmurs, rubs, or gallops,tr ankle edema  Respiratory:  clear to auscultation bilaterally, normal work of breathing GI: soft, nontender, nondistended, + BS MS: no deformity or atrophy  Skin: warm and dry, no rash Neuro:  Strength and sensation are intact Psych: euthymic mood, full affect   EKG:   The ekg ordered today demonstrates NSR, prolonged PR interval, RBBB   Recent Labs: No results found for requested labs within last 8760 hours.   Lipid Panel    Component Value Date/Time   CHOL  05/30/2007 1837    154        ATP III CLASSIFICATION:  <200     mg/dL    Desirable  200-239  mg/dL   Borderline High  >=240    mg/dL   High   TRIG 65 05/30/2007 1837   HDL 52 05/30/2007 1837   CHOLHDL 3.0 05/30/2007 1837   VLDL 13 05/30/2007 1837   LDLCALC  05/30/2007 1837    89        Total Cholesterol/HDL:CHD Risk Coronary Heart Disease Risk Table                     Men   Women  1/2 Average Risk   3.4   3.3     Other studies Reviewed: Additional studies/ records that were reviewed today with results demonstrating: operative record from 2008.   ASSESSMENT AND PLAN:  1. CAD: No angina .  Continue aggressive secondary prevention.  Refill NTG.   2. HTN: BP controlled.  Continue daily HCTZ and decreased Lisinopril.  Has improved with exercise. 3. Hyperlipidemia: Lipids from the [place] reviewed and well controlled. 4. Hyperkalemia: Noted in 2016.  Followed at his PMD. 5. Mitral valve disease: No sign of CHF.  6. Dilated aortic root: Checked in 2013- 4.1 cm.  No echo at this point unless he has sx.   Current medicines are reviewed at length with the patient today.  The patient concerns regarding his medicines were addressed.  The following changes have been made:  No change  Labs/ tests ordered today include:   No orders of the defined types were placed in this encounter.   Recommend 150 minutes/week of aerobic exercise Low fat, low carb, high fiber diet recommended  Disposition:   FU in 1 year   Signed, Larae Grooms, MD  06/07/2016 9:09 AM    [facility] [place] [address], [place], [place]  [address] Phone: [phone]; Fax: [phone]

## 2016-06-07 ENCOUNTER — Ambulatory Visit (INDEPENDENT_AMBULATORY_CARE_PROVIDER_SITE_OTHER): Payer: Medicare Other | Admitting: Interventional Cardiology

## 2016-06-07 ENCOUNTER — Encounter: Payer: Self-pay | Admitting: Interventional Cardiology

## 2016-06-07 VITALS — BP 110/60 | HR 67 | Ht 73.5 in | Wt 212.0 lb

## 2016-06-07 DIAGNOSIS — I10 Essential (primary) hypertension: Secondary | ICD-10-CM

## 2016-06-07 DIAGNOSIS — I2510 Atherosclerotic heart disease of native coronary artery without angina pectoris: Secondary | ICD-10-CM | POA: Diagnosis not present

## 2016-06-07 DIAGNOSIS — I340 Nonrheumatic mitral (valve) insufficiency: Secondary | ICD-10-CM | POA: Diagnosis not present

## 2016-06-07 DIAGNOSIS — E782 Mixed hyperlipidemia: Secondary | ICD-10-CM | POA: Diagnosis not present

## 2016-06-07 MED ORDER — NITROGLYCERIN 0.4 MG SL SUBL: 0.4000 mg | SUBLINGUAL_TABLET | SUBLINGUAL | 3 refills | Status: DC | PRN

## 2016-06-07 NOTE — Patient Instructions (Signed)
**Note De-identified Zaviyar Rahal Obfuscation** Medication Instructions:  Same-no changes  Labwork: None  Testing/Procedures: None  Follow-Up: Your physician wants you to follow-up in: 1 year.You will receive a reminder letter in the mail two months in advance. If you don't receive a letter, please call our office to schedule the follow-up appointment.   Any Other Special Instructions Will Be Listed Below (If Applicable).     If you need a refill on your cardiac medications before your next appointment, please call your pharmacy.   

## 2016-06-16 ENCOUNTER — Other Ambulatory Visit (HOSPITAL_COMMUNITY): Payer: Self-pay | Admitting: Family Medicine

## 2016-06-16 ENCOUNTER — Other Ambulatory Visit: Payer: Self-pay | Admitting: Family Medicine

## 2016-06-16 ENCOUNTER — Ambulatory Visit (HOSPITAL_COMMUNITY)
Admission: RE | Admit: 2016-06-16 | Discharge: 2016-06-16 | Disposition: A | Discharge status: Home / Self Care | Payer: Medicare Other | Source: Ambulatory Visit | Attending: Family Medicine | Admitting: Family Medicine

## 2016-06-16 ENCOUNTER — Encounter (HOSPITAL_COMMUNITY): Payer: Self-pay

## 2016-06-16 DIAGNOSIS — Z125 Encounter for screening for malignant neoplasm of prostate: Secondary | ICD-10-CM

## 2016-06-16 DIAGNOSIS — R310 Gross hematuria: Secondary | ICD-10-CM

## 2016-06-16 DIAGNOSIS — K8020 Calculus of gallbladder without cholecystitis without obstruction: Secondary | ICD-10-CM | POA: Diagnosis not present

## 2016-06-16 DIAGNOSIS — Z9889 Other specified postprocedural states: Secondary | ICD-10-CM | POA: Diagnosis not present

## 2016-06-16 DIAGNOSIS — N200 Calculus of kidney: Secondary | ICD-10-CM | POA: Diagnosis not present

## 2016-06-16 DIAGNOSIS — N329 Bladder disorder, unspecified: Secondary | ICD-10-CM

## 2016-06-16 DIAGNOSIS — R109 Unspecified abdominal pain: Secondary | ICD-10-CM

## 2016-06-16 DIAGNOSIS — R319 Hematuria, unspecified: Secondary | ICD-10-CM

## 2016-06-16 LAB — POCT I-STAT CREATININE: CREATININE: 1.1 mg/dL (ref 0.61–1.24)

## 2016-06-16 MED ORDER — IOPAMIDOL (ISOVUE-300) INJECTION 61%
100.0000 mL | Freq: Once | INTRAVENOUS | Status: AC | PRN
  Administered 2016-06-16: 100 mL via INTRAVENOUS

## 2016-09-24 DIAGNOSIS — I10 Essential (primary) hypertension: Secondary | ICD-10-CM | POA: Diagnosis not present

## 2016-09-24 DIAGNOSIS — J310 Chronic rhinitis: Secondary | ICD-10-CM | POA: Diagnosis not present

## 2016-09-24 DIAGNOSIS — G4700 Insomnia, unspecified: Secondary | ICD-10-CM | POA: Diagnosis not present

## 2016-09-24 DIAGNOSIS — Z23 Encounter for immunization: Secondary | ICD-10-CM | POA: Diagnosis not present

## 2016-09-24 DIAGNOSIS — E782 Mixed hyperlipidemia: Secondary | ICD-10-CM | POA: Diagnosis not present

## 2017-06-09 ENCOUNTER — Ambulatory Visit (INDEPENDENT_AMBULATORY_CARE_PROVIDER_SITE_OTHER): Payer: Medicare Other | Admitting: Interventional Cardiology

## 2017-06-09 ENCOUNTER — Encounter: Payer: Self-pay | Admitting: Interventional Cardiology

## 2017-06-09 VITALS — BP 114/48 | HR 68 | Ht 73.0 in | Wt 219.4 lb

## 2017-06-09 DIAGNOSIS — I4510 Unspecified right bundle-branch block: Secondary | ICD-10-CM | POA: Diagnosis not present

## 2017-06-09 DIAGNOSIS — E782 Mixed hyperlipidemia: Secondary | ICD-10-CM | POA: Diagnosis not present

## 2017-06-09 DIAGNOSIS — I2510 Atherosclerotic heart disease of native coronary artery without angina pectoris: Secondary | ICD-10-CM | POA: Diagnosis not present

## 2017-06-09 DIAGNOSIS — R0609 Other forms of dyspnea: Secondary | ICD-10-CM

## 2017-06-09 DIAGNOSIS — I10 Essential (primary) hypertension: Secondary | ICD-10-CM | POA: Diagnosis not present

## 2017-06-09 NOTE — Patient Instructions (Signed)

## 2017-06-09 NOTE — Progress Notes (Signed)
Cardiology Office Note   Date:  06/09/2017   ID:  DVAUGHN FICKLE, DOB [birth year], MRN [id number]  PCP:  Alroy Dust, Carlean Jews.Marlou Sa, MD    No chief complaint on file. CAD   Wt Readings from Last 3 Encounters:  06/09/17 219 lb 6.4 oz (99.5 kg)  06/07/16 212 lb (96.2 kg)  06/04/15 222 lb 12.8 oz (101.1 kg)       History of Present Illness: Terry [name] is a 83 y.o. male  who has CAD.  Coronary artery bypass grafting x3 (left internal mammary artery to  left anterior descending, saphenous vein graft to circumflex  marginal, saphenous vein graft to posterior descending) in 2008 with Mitral valve annuloplasty repair for ischemic mitral regurgitation  with a 28 mm McCarthy-Adams ring.  Left atrial and right atrial MAZE procedure for preoperative atrial  flutter.  Stapling of left atrial appendage.  Denies : Chest pain. Dizziness. Leg edema. Nitroglycerin use. Orthopnea. Palpitations. Paroxysmal nocturnal dyspnea.  Syncope.   He continues to exercise several times a week. He goes to a cardiac rehabilitation program. He is careful to avoid falling due to decreasing balance.  He continues to work in his yard. He very rarely has some dyspnea on exertion. He denies any of this with exercise or with his work in the yard. He has not had issues with orthopnea. He did cut back on his HCTZ. He has not noticed increased swelling.     Past Medical History:  Diagnosis Date  . Arrhythmia    atrial fib  . Coronary artery disease   . Hypertension   . Low back syndrome    Dr. Nelva Bush    Past Surgical History:  Procedure Laterality Date  . CORONARY ARTERY BYPASS GRAFT  2008   MV REPLACEMENT  . INGUINAL HERNIA REPAIR Bilateral   . LUMBAR LAMINECTOMY    . RIGHT KNEE ARTHROSCOPY       Current Outpatient Prescriptions  Medication Sig Dispense Refill  . amLODipine (NORVASC) 5 MG tablet Take 5 mg by mouth daily.    Marland Kitchen aspirin 81 MG tablet Take 81 mg by mouth daily.    .  hydrochlorothiazide (HYDRODIURIL) 25 MG tablet Take 12.5 mg by mouth daily.     Marland Kitchen lisinopril (PRINIVIL,ZESTRIL) 20 MG tablet Take 20 mg by mouth daily.    . metoprolol succinate (TOPROL-XL) 25 MG 24 hr tablet Take 25 mg by mouth daily.    . nitroGLYCERIN (NITROSTAT) 0.4 MG SL tablet Place 1 tablet (0.4 mg total) under the tongue every 5 (five) minutes as needed for chest pain. 25 tablet 3  . Omega-3 Fatty Acids (FISH OIL) 1000 MG CAPS Take 1,000 mg by mouth 2 (two) times daily. Gets from the [place].    . simvastatin (ZOCOR) 20 MG tablet Take 20 mg by mouth daily.    . temazepam (RESTORIL) 30 MG capsule Take 30 mg by mouth at bedtime as needed for sleep.     No current facility-administered medications for this visit.     Allergies:   Patient has no known allergies.    Social History:  The patient  reports that he has quit smoking. He has never used smokeless tobacco.   Family History:  The patient's family history includes Heart attack in his brother, father, and other; Heart disease in his son.    ROS:  Please see the history of present illness.   Otherwise, review of systems are positive for Decreased balance and rare shortness of breath.  All other systems are reviewed and negative.    PHYSICAL EXAM: VS:  BP (!) 114/48 (BP Location: Left Arm, Patient Position: Sitting)   Pulse 68   Ht 6\' 1"  (1.854 m)   Wt 219 lb 6.4 oz (99.5 kg)   SpO2 94%   BMI 28.95 kg/m  , BMI Body mass index is 28.95 kg/m. GEN: Well nourished, well developed, in no acute distress  HEENT: normal  Neck: no JVD, carotid bruits, or masses Cardiac: RRR; soft systolic murmur, no rubs, or gallops,no edema  Respiratory:  clear to auscultation bilaterally, normal work of breathing GI: soft, nontender, nondistended, + BS MS: no deformity or atrophy  Skin: warm and dry, no rash Neuro:  Strength and sensation are intact Psych: euthymic mood, full affect   EKG:   The ekg ordered today demonstrates NSR, RBBB,  prolonged PR interval   Recent Labs: 06/16/2016: Creatinine, Ser 1.10   Lipid Panel    Component Value Date/Time   CHOL  05/30/2007 1837    154        ATP III CLASSIFICATION:  <200     mg/dL   Desirable  200-239  mg/dL   Borderline High  >=240    mg/dL   High   TRIG 65 05/30/2007 1837   HDL 52 05/30/2007 1837   CHOLHDL 3.0 05/30/2007 1837   VLDL 13 05/30/2007 1837   [place]  05/30/2007 1837    89        Total Cholesterol/HDL:CHD Risk Coronary Heart Disease Risk Table                     Men   Women  1/2 Average Risk   3.4   3.3     Other studies Reviewed: Additional studies/ records that were reviewed today with results demonstrating: Normal LV function in 2013 by echo.   ASSESSMENT AND PLAN:  1. CAD: No angina. Continue aggressive secondary prevention. Lipids to be checked at the [place] in October. 2. RBBB: with prolonged PR.  Stop metoprolol if there is any bradycardia, but HR has been in the normal range. 3. HTN: COntrolled. Continue current meds. 4. DOE: Rare.  Could increase HCTZ if he feels fluid overloaded to one full tab, once a week.     Current medicines are reviewed at length with the patient today.  The patient concerns regarding his medicines were addressed.  The following changes have been made:  No change  Labs/ tests ordered today include:  No orders of the defined types were placed in this encounter.   Recommend 150 minutes/week of aerobic exercise Low fat, low carb, high fiber diet recommended  Disposition:   FU in 1 year   Signed, Larae Grooms, MD  06/09/2017 2:49 PM    [facility] [place], [place], [place]  [address] Phone: [phone]; Fax: [phone]

## 2017-06-10 DIAGNOSIS — I4510 Unspecified right bundle-branch block: Secondary | ICD-10-CM | POA: Insufficient documentation

## 2017-06-20 DIAGNOSIS — H6123 Impacted cerumen, bilateral: Secondary | ICD-10-CM | POA: Diagnosis not present

## 2017-06-20 DIAGNOSIS — L723 Sebaceous cyst: Secondary | ICD-10-CM | POA: Diagnosis not present

## 2017-06-20 DIAGNOSIS — H9190 Unspecified hearing loss, unspecified ear: Secondary | ICD-10-CM | POA: Diagnosis not present

## 2017-09-26 DIAGNOSIS — G4700 Insomnia, unspecified: Secondary | ICD-10-CM | POA: Diagnosis not present

## 2017-09-26 DIAGNOSIS — I10 Essential (primary) hypertension: Secondary | ICD-10-CM | POA: Diagnosis not present

## 2017-09-26 DIAGNOSIS — J310 Chronic rhinitis: Secondary | ICD-10-CM | POA: Diagnosis not present

## 2017-09-26 DIAGNOSIS — E782 Mixed hyperlipidemia: Secondary | ICD-10-CM | POA: Diagnosis not present

## 2018-02-23 DIAGNOSIS — D171 Benign lipomatous neoplasm of skin and subcutaneous tissue of trunk: Secondary | ICD-10-CM | POA: Diagnosis not present

## 2018-02-23 DIAGNOSIS — M7022 Olecranon bursitis, left elbow: Secondary | ICD-10-CM | POA: Diagnosis not present

## 2018-03-24 DIAGNOSIS — H5203 Hypermetropia, bilateral: Secondary | ICD-10-CM | POA: Diagnosis not present

## 2018-03-24 DIAGNOSIS — Z961 Presence of intraocular lens: Secondary | ICD-10-CM | POA: Diagnosis not present

## 2018-03-24 DIAGNOSIS — H25811 Combined forms of age-related cataract, right eye: Secondary | ICD-10-CM | POA: Diagnosis not present

## 2018-03-24 DIAGNOSIS — H4322 Crystalline deposits in vitreous body, left eye: Secondary | ICD-10-CM | POA: Diagnosis not present

## 2018-06-19 NOTE — Progress Notes (Signed)
Cardiology Office Note   Date:  06/20/2018   ID:  Terry [name], DOB 1923/12/23, MRN [id number]  PCP:  Terry [name], Terry Jews.Marlou Sa, MD    No chief complaint on file.  CAD  Wt Readings from Last 3 Encounters:  06/20/18 220 lb 6.4 oz (100 kg)  06/09/17 219 lb 6.4 oz (99.5 kg)  06/07/16 212 lb (96.2 kg)       History of Present Illness: Terry [name] is a 84 y.o. male  who has CAD. Coronary artery bypass grafting x3 (left internal mammary artery to  left anterior descending, saphenous vein graft to circumflex  marginal, saphenous vein graft to posterior descending) in 2008 with Mitral valve annuloplasty repair for ischemic mitral regurgitation  with a 28 mm McCarthy-Adams ring.  Left atrial and right atrial MAZE procedure for preoperative atrial  flutter.  Stapling of left atrial appendage.  He does exercise at [facility] in [place] 3x/week.  He does these exercises, weights and bike at home.    Denies : Chest pain. Dizziness. Leg edema. Nitroglycerin use. Orthopnea. Palpitations. Paroxysmal nocturnal dyspnea. Shortness of breath. Syncope.   Past Medical History:  Diagnosis Date  . Arrhythmia    atrial fib  . Coronary artery disease   . Coronary atherosclerosis 12/28/2013   Mitral valve annuloplasty repair for ischemic mitral regurgitation  with a 28 mm McCarthy-Adams ring.  Left atrial and right atrial MAZE procedure for preoperative atrial  flutter.  Stapling of left atrial appendage. Coronary artery bypass grafting x3 (left internal mammary artery to  left anterior descending, saphenous vein graft to circumflex  marginal, saphenous vein graft to   . Edema 05/22/2014   Left ankle slightly larger than right-prior vein harvest   . Essential hypertension, benign 12/28/2013  . Hypertension   . Low back syndrome    Dr. Nelva [name]  . Mitral valve regurgitation, ischemic 06/04/2015   S/p repair in 2008     Past Surgical History:  Procedure Laterality Date   . CORONARY ARTERY BYPASS GRAFT  2008   MV REPLACEMENT  . INGUINAL HERNIA REPAIR Bilateral   . LUMBAR LAMINECTOMY    . RIGHT KNEE ARTHROSCOPY       Current Outpatient Medications  Medication Sig Dispense Refill  . amLODipine (NORVASC) 5 MG tablet Take 5 mg by mouth daily.    Marland Kitchen aspirin 81 MG tablet Take 81 mg by mouth daily.    . hydrochlorothiazide (HYDRODIURIL) 25 MG tablet Take 12.5 mg by mouth daily.     Marland Kitchen lisinopril (PRINIVIL,ZESTRIL) 20 MG tablet Take 20 mg by mouth daily.    . metoprolol succinate (TOPROL-XL) 25 MG 24 hr tablet Take 25 mg by mouth daily.    . nitroGLYCERIN (NITROSTAT) 0.4 MG SL tablet Place 1 tablet (0.4 mg total) under the tongue every 5 (five) minutes as needed for chest pain. 25 tablet 3  . Omega-3 Fatty Acids (FISH OIL) 1000 MG CAPS Take 1,000 mg by mouth 2 (two) times daily. Gets from the [place].    . simvastatin (ZOCOR) 20 MG tablet Take 20 mg by mouth daily.    . temazepam (RESTORIL) 30 MG capsule Take 30 mg by mouth at bedtime as needed for sleep.     No current facility-administered medications for this visit.     Allergies:   Patient has no known allergies.    Social History:  The patient  reports that he has quit smoking. He has never used smokeless tobacco.   Family History:  The  patient's family history includes Heart attack in his brother, father, and other; Heart disease in his son.    ROS:  Please see the history of present illness.   Otherwise, review of systems are positive for rare swelling.   All other systems are reviewed and negative.    PHYSICAL EXAM: VS:  BP 114/62   Pulse 64   Ht 6\' 2"  (1.88 m)   Wt 220 lb 6.4 oz (100 kg)   SpO2 97%   BMI 28.30 kg/m  , BMI Body mass index is 28.3 kg/m. GEN: Well nourished, well developed, in no acute distress  HEENT: normal  Neck: no JVD, carotid bruits, or masses Cardiac: RRR; no murmurs, rubs, or gallops,no edema  Respiratory:  clear to auscultation bilaterally, normal work of  breathing GI: soft, nontender, nondistended, + BS MS: no deformity or atrophy  Skin: warm and dry, no rash Neuro:  Strength and sensation are intact Psych: euthymic mood, full affect   EKG:   The ekg ordered today demonstrates NSR, prolonged PR interval, RBBB   Recent Labs: No results found for requested labs within last 8760 hours.   Lipid Panel    Component Value Date/Time   CHOL  05/30/2007 1837    154        ATP III CLASSIFICATION:  <200     mg/dL   Desirable  200-239  mg/dL   Borderline High  >=240    mg/dL   High   TRIG 65 05/30/2007 1837   HDL 52 05/30/2007 1837   CHOLHDL 3.0 05/30/2007 1837   VLDL 13 05/30/2007 1837   LDLCALC  05/30/2007 1837    89        Total Cholesterol/HDL:CHD Risk Coronary Heart Disease Risk Table                     Men   Women  1/2 Average Risk   3.4   3.3     Other studies Reviewed: Additional studies/ records that were reviewed today with results demonstrating: Cr 1.36.   ASSESSMENT AND PLAN:  1. CAD: No angina on medical therapy.  Continue aggressive secondary prevention.  Continue regular exercise. 2. RBBB: Chronic 3. HTN: Blood pressure well controlled. 4. DOE: Improved.  COuld increase HCTZ to one full tab per week, if his swelling gets worse or if there are other signs of volume overload.  5. Glucose was 85 on blood check.  He was asked to start metformin.  WOuld not start metformin based on that blood sugar.  Perhaps, hemoglobin A 1C would be helpful.  He can get this checked with Dr. Alroy [name].  He will also need lipids checked as I did not see them on his blood work from the [place].   Current medicines are reviewed at length with the patient today.  The patient concerns regarding his medicines were addressed.  The following changes have been made:  No change  Labs/ tests ordered today include:   Orders Placed This Encounter  Procedures  . EKG 12-Lead    Recommend 150 minutes/week of aerobic exercise Low fat, low carb,  high fiber diet recommended  Disposition:   FU in 1 year   Signed, Terry Grooms, MD  06/20/2018 11:04 AM    [facility] [place], [place], [place]  [address] Phone: [phone]; Fax: [phone]

## 2018-06-20 ENCOUNTER — Encounter: Payer: Self-pay | Admitting: Interventional Cardiology

## 2018-06-20 ENCOUNTER — Encounter (INDEPENDENT_AMBULATORY_CARE_PROVIDER_SITE_OTHER): Payer: Self-pay

## 2018-06-20 ENCOUNTER — Ambulatory Visit: Payer: Medicare Other | Admitting: Interventional Cardiology

## 2018-06-20 VITALS — BP 114/62 | HR 64 | Ht 74.0 in | Wt 220.4 lb

## 2018-06-20 DIAGNOSIS — I4510 Unspecified right bundle-branch block: Secondary | ICD-10-CM | POA: Diagnosis not present

## 2018-06-20 DIAGNOSIS — I2510 Atherosclerotic heart disease of native coronary artery without angina pectoris: Secondary | ICD-10-CM | POA: Diagnosis not present

## 2018-06-20 DIAGNOSIS — I10 Essential (primary) hypertension: Secondary | ICD-10-CM

## 2018-06-20 DIAGNOSIS — E782 Mixed hyperlipidemia: Secondary | ICD-10-CM | POA: Diagnosis not present

## 2018-06-20 MED ORDER — NITROGLYCERIN 0.4 MG SL SUBL: 0.4000 mg | SUBLINGUAL_TABLET | SUBLINGUAL | 3 refills | Status: DC | PRN

## 2018-06-20 NOTE — Patient Instructions (Signed)

## 2018-10-12 DIAGNOSIS — G4700 Insomnia, unspecified: Secondary | ICD-10-CM | POA: Diagnosis not present

## 2018-10-12 DIAGNOSIS — I10 Essential (primary) hypertension: Secondary | ICD-10-CM | POA: Diagnosis not present

## 2018-10-12 DIAGNOSIS — E782 Mixed hyperlipidemia: Secondary | ICD-10-CM | POA: Diagnosis not present

## 2018-10-12 DIAGNOSIS — J310 Chronic rhinitis: Secondary | ICD-10-CM | POA: Diagnosis not present

## 2019-01-31 DIAGNOSIS — R1110 Vomiting, unspecified: Secondary | ICD-10-CM | POA: Diagnosis not present

## 2019-01-31 DIAGNOSIS — G44309 Post-traumatic headache, unspecified, not intractable: Secondary | ICD-10-CM | POA: Diagnosis not present

## 2019-01-31 DIAGNOSIS — R51 Headache: Secondary | ICD-10-CM | POA: Diagnosis not present

## 2019-01-31 DIAGNOSIS — S0101XA Laceration without foreign body of scalp, initial encounter: Secondary | ICD-10-CM | POA: Diagnosis not present

## 2019-01-31 DIAGNOSIS — R319 Hematuria, unspecified: Secondary | ICD-10-CM | POA: Diagnosis not present

## 2019-01-31 DIAGNOSIS — N390 Urinary tract infection, site not specified: Secondary | ICD-10-CM | POA: Diagnosis not present

## 2019-01-31 DIAGNOSIS — Z23 Encounter for immunization: Secondary | ICD-10-CM | POA: Diagnosis not present

## 2019-01-31 DIAGNOSIS — S0990XA Unspecified injury of head, initial encounter: Secondary | ICD-10-CM | POA: Diagnosis not present

## 2019-02-07 DIAGNOSIS — N3090 Cystitis, unspecified without hematuria: Secondary | ICD-10-CM | POA: Diagnosis not present

## 2019-02-07 DIAGNOSIS — N400 Enlarged prostate without lower urinary tract symptoms: Secondary | ICD-10-CM | POA: Diagnosis not present

## 2019-02-07 DIAGNOSIS — R2689 Other abnormalities of gait and mobility: Secondary | ICD-10-CM | POA: Diagnosis not present

## 2019-02-07 DIAGNOSIS — S0101XA Laceration without foreign body of scalp, initial encounter: Secondary | ICD-10-CM | POA: Diagnosis not present

## 2019-07-12 NOTE — Progress Notes (Signed)
Cardiology Office Note   Date:  07/13/2019   ID:  DAVINDER MIGNEAULT, DOB [birth year], MRN [id number]  PCP:  Alroy Dust, Carlean Jews.Marlou Sa, MD    No chief complaint on file.  CAD  Wt Readings from Last 3 Encounters:  07/13/19 209 lb (94.8 kg)  06/20/18 220 lb 6.4 oz (100 kg)  06/09/17 219 lb 6.4 oz (99.5 kg)       History of Present Illness: Terry [name] is a 85 y.o. male  who has CAD. Coronary artery bypass grafting x3 (left internal mammary artery to  left anterior descending, saphenous vein graft to circumflex  marginal, saphenous vein graft to posterior descending) in 2008 with Mitral valve annuloplasty repair for ischemic mitral regurgitation  with a 28 mm McCarthy-Adams ring.  Left atrial and right atrial MAZE procedure for preoperative atrial  flutter.  Stapling of left atrial appendage.  He did exercise at [facility] in [place] 3x/week before COVID-19.  He does these exercises, weights and bike at home.    In 2019, he had some DOE.  Plan was :"COuld increase HCTZ to one full tab per week, if his swelling gets worse or if there are other signs of volume overload. "   Since the last visit, he has felt well.  One fall.  He did not remember the fall.  Son reminded him.  Occurred in the setting of a UTI.   Denies : Chest pain. Dizziness. Leg edema. Nitroglycerin use. Orthopnea. Palpitations. Paroxysmal nocturnal dyspnea. Shortness of breath. Syncope.      Past Medical History:  Diagnosis Date  . Arrhythmia    atrial fib  . Coronary artery disease   . Coronary atherosclerosis 12/28/2013   Mitral valve annuloplasty repair for ischemic mitral regurgitation  with a 28 mm McCarthy-Adams ring.  Left atrial and right atrial MAZE procedure for preoperative atrial  flutter.  Stapling of left atrial appendage. Coronary artery bypass grafting x3 (left internal mammary artery to  left anterior descending, saphenous vein graft to circumflex  marginal,  saphenous vein graft to   . Edema 05/22/2014   Left ankle slightly larger than right-prior vein harvest   . Essential hypertension, benign 12/28/2013  . Hypertension   . Low back syndrome    Dr. Nelva Bush  . Mitral valve regurgitation, ischemic 06/04/2015   S/p repair in 2008     Past Surgical History:  Procedure Laterality Date  . CORONARY ARTERY BYPASS GRAFT  2008   MV REPLACEMENT  . INGUINAL HERNIA REPAIR Bilateral   . LUMBAR LAMINECTOMY    . RIGHT KNEE ARTHROSCOPY       Current Outpatient Medications  Medication Sig Dispense Refill  . amLODipine (NORVASC) 5 MG tablet Take 5 mg by mouth daily.    Marland Kitchen aspirin 81 MG tablet Take 81 mg by mouth daily.    . hydrochlorothiazide (HYDRODIURIL) 25 MG tablet Take 12.5 mg by mouth daily.     Marland Kitchen lisinopril (PRINIVIL,ZESTRIL) 20 MG tablet Take 20 mg by mouth daily.    . metoprolol succinate (TOPROL-XL) 25 MG 24 hr tablet Take 25 mg by mouth daily.    . nitroGLYCERIN (NITROSTAT) 0.4 MG SL tablet Place 1 tablet (0.4 mg total) under the tongue every 5 (five) minutes as needed for chest pain. 25 tablet 3  . Omega-3 Fatty Acids (FISH OIL) 1000 MG CAPS Take 1,000 mg by mouth 2 (two) times daily. Gets from the [place].    . simvastatin (ZOCOR) 20 MG tablet Take 20 mg by  mouth daily.    . temazepam (RESTORIL) 30 MG capsule Take 30 mg by mouth at bedtime as needed for sleep.     No current facility-administered medications for this visit.     Allergies:   Patient has no known allergies.    Social History:  The patient  reports that he has quit smoking. He has never used smokeless tobacco.   Family History:  The patient's family history includes Heart attack in his brother, father, and another family member; Heart disease in his son.    ROS:  Please see the history of present illness.   Otherwise, review of systems are positive for left foot drop and swelling-chronic; one fall.   All other systems are reviewed and negative.    PHYSICAL EXAM: VS:  BP (!)  114/52   Pulse 66   Ht 6\' 3"  (1.905 m)   Wt 209 lb (94.8 kg)   BMI 26.12 kg/m  , BMI Body mass index is 26.12 kg/m. GEN: Well nourished, well developed, in no acute distress  HEENT: normal  Neck: no JVD, carotid bruits, or masses Cardiac: RRR; no murmurs, rubs, or gallops,; left leg edema - chronic Respiratory:  clear to auscultation bilaterally, normal work of breathing GI: soft, nontender, nondistended, + BS MS: no deformity or atrophy  Skin: warm and dry, no rash Neuro:  Strength and sensation are intact; perhaps memory is declining as the son who is present is having to remind him of certain things like his fall Psych: euthymic mood, full affect   EKG:   The ekg ordered today demonstrates NSR, RBBB, prolonged PR interval   Recent Labs: No results found for requested labs within last 8760 hours.   Lipid Panel    Component Value Date/Time   CHOL  05/30/2007 1837    154        ATP III CLASSIFICATION:  <200     mg/dL   Desirable  200-239  mg/dL   Borderline High  >=240    mg/dL   High   TRIG 65 05/30/2007 1837   HDL 52 05/30/2007 1837   CHOLHDL 3.0 05/30/2007 1837   VLDL 13 05/30/2007 1837   LDLCALC  05/30/2007 1837    89        Total Cholesterol/HDL:CHD Risk Coronary Heart Disease Risk Table                     Men   Women  1/2 Average Risk   3.4   3.3     Other studies Reviewed: Additional studies/ records that were reviewed today with results demonstrating: labs reviewed.   ASSESSMENT AND PLAN:  1. CAD: No angina. Continue aggressive secondary prevention. LDL 79.  2. RBBB: Chronic 3. HTN: The current medical regimen is effective;  continue present plan and medications. 4. DOE: resolved. Continue current dose of diuretics. 5. I will call his daughter, [name], to update her.   Current medicines are reviewed at length with the patient today.  The patient concerns regarding his medicines were addressed.  The following changes have been made:  No change   Labs/ tests ordered today include:   Orders Placed This Encounter  Procedures  . EKG 12-Lead    Recommend 150 minutes/week of aerobic exercise Low fat, low carb, high fiber diet recommended  Disposition:   FU in 1 year   Signed, Larae Grooms, MD  07/13/2019 1:12 PM    [facility]  [address] [place], [place]  [address] Phone: [phone]; Fax: [phone]

## 2019-07-13 ENCOUNTER — Encounter: Payer: Self-pay | Admitting: Interventional Cardiology

## 2019-07-13 ENCOUNTER — Other Ambulatory Visit: Payer: Self-pay

## 2019-07-13 ENCOUNTER — Encounter (INDEPENDENT_AMBULATORY_CARE_PROVIDER_SITE_OTHER): Payer: Self-pay

## 2019-07-13 ENCOUNTER — Ambulatory Visit: Payer: Medicare Other | Admitting: Interventional Cardiology

## 2019-07-13 VITALS — BP 114/52 | HR 66 | Ht 75.0 in | Wt 209.0 lb

## 2019-07-13 DIAGNOSIS — I10 Essential (primary) hypertension: Secondary | ICD-10-CM | POA: Diagnosis not present

## 2019-07-13 DIAGNOSIS — I4510 Unspecified right bundle-branch block: Secondary | ICD-10-CM

## 2019-07-13 DIAGNOSIS — E782 Mixed hyperlipidemia: Secondary | ICD-10-CM

## 2019-07-13 DIAGNOSIS — I2510 Atherosclerotic heart disease of native coronary artery without angina pectoris: Secondary | ICD-10-CM | POA: Diagnosis not present

## 2019-07-13 NOTE — Patient Instructions (Signed)

## 2019-10-01 DIAGNOSIS — Z23 Encounter for immunization: Secondary | ICD-10-CM | POA: Diagnosis not present

## 2020-07-17 ENCOUNTER — Encounter: Payer: Self-pay | Admitting: Interventional Cardiology

## 2020-07-17 ENCOUNTER — Other Ambulatory Visit: Payer: Self-pay

## 2020-07-17 ENCOUNTER — Ambulatory Visit: Payer: Medicare PPO | Admitting: Interventional Cardiology

## 2020-07-17 VITALS — BP 114/50 | HR 64 | Ht 75.0 in | Wt 218.4 lb

## 2020-07-17 DIAGNOSIS — I2510 Atherosclerotic heart disease of native coronary artery without angina pectoris: Secondary | ICD-10-CM | POA: Diagnosis not present

## 2020-07-17 DIAGNOSIS — I10 Essential (primary) hypertension: Secondary | ICD-10-CM

## 2020-07-17 DIAGNOSIS — E782 Mixed hyperlipidemia: Secondary | ICD-10-CM

## 2020-07-17 DIAGNOSIS — I4510 Unspecified right bundle-branch block: Secondary | ICD-10-CM | POA: Diagnosis not present

## 2020-07-17 DIAGNOSIS — R7303 Prediabetes: Secondary | ICD-10-CM

## 2020-07-17 NOTE — Patient Instructions (Signed)
Medication Instructions:  Your physician recommends that you continue on your current medications as directed. Please refer to the Current Medication list given to you today.  *If you need a refill on your cardiac medications before your next appointment, please call your pharmacy*   Lab Work: None  If you have labs (blood work) drawn today and your tests are completely normal, you will receive your results only by: . MyChart Message (if you have MyChart) OR . A paper copy in the mail If you have any lab test that is abnormal or we need to change your treatment, we will call you to review the results.   Testing/Procedures: None   Follow-Up: At [facility], you and your health needs are our priority.  As part of our continuing mission to provide you with exceptional heart care, we have created designated Provider Care Teams.  These Care Teams include your primary Cardiologist (physician) and Advanced Practice Providers (APPs -  Physician Assistants and Nurse Practitioners) who all work together to provide you with the care you need, when you need it.  We recommend signing up for the patient portal called "MyChart".  Sign up information is provided on this After Visit Summary.  MyChart is used to connect with patients for Virtual Visits (Telemedicine).  Patients are able to view lab/test results, encounter notes, upcoming appointments, etc.  Non-urgent messages can be sent to your provider as well.   To learn more about what you can do with MyChart, go to [email or web address].    Your next appointment:   12 month(s)  The format for your next appointment:   In Person  Provider:   You may see Jay Varanasi, MD or one of the following Advanced Practice Providers on your designated Care Team:    Dayna Dunn, PA-C  Michele Lenze, PA-C    Other Instructions None  

## 2020-07-17 NOTE — Progress Notes (Signed)
Cardiology Office Note   Date:  07/17/2020   ID:  Terry [name], DOB August 26, 1924, MRN [id number]  PCP:  Terry [name], Terry Jews.Marlou Sa, MD    No chief complaint on file.  CAD  Wt Readings from Last 3 Encounters:  07/17/20 218 lb 6.4 oz (99.1 kg)  07/13/19 209 lb (94.8 kg)  06/20/18 220 lb 6.4 oz (100 kg)       History of Present Illness: [name] is a 86 y.o. male  who has CAD. Coronary artery bypass grafting x3 (left internal mammary artery to  left anterior descending, saphenous vein graft to circumflex  marginal, saphenous vein graft to posterior descending) in 2008 with Mitral valve annuloplasty repair for ischemic mitral regurgitation  with a 28 mm McCarthy-Adams ring.  Left atrial and right atrial MAZE procedure for preoperative atrial  flutter.  Stapling of left atrial appendage.  He did exercise at [facility] in [place] 3x/week before COVID-19.  In the past, used weights and bike at home.   In 2019, he had some DOE.  Plan was :"COuld increase HCTZ to one full tab per week, if his swelling gets worse or if there are other signs of volume overload."   He has some chronic rhinorrhea.  Worse when he eats.  Daughter wondering if it is related to allergies.    He feels ok driving.  Drives a [employer].  Denies : Chest pain. Dizziness. Leg edema. Nitroglycerin use. Orthopnea. Palpitations. Paroxysmal nocturnal dyspnea. Shortness of breath. Syncope.   No recent falls.   He got his vaccines.  No problems. He stays active at home.  Lives alone with some assistance.    Past Medical History:  Diagnosis Date  . Arrhythmia    atrial fib  . Coronary artery disease   . Coronary atherosclerosis 12/28/2013   Mitral valve annuloplasty repair for ischemic mitral regurgitation  with a 28 mm McCarthy-Adams ring.  Left atrial and right atrial MAZE procedure for preoperative atrial  flutter.  Stapling of left atrial appendage. Coronary artery bypass grafting  x3 (left internal mammary artery to  left anterior descending, saphenous vein graft to circumflex  marginal, saphenous vein graft to   . Edema 05/22/2014   Left ankle slightly larger than right-prior vein harvest   . Essential hypertension, benign 12/28/2013  . Hypertension   . Low back syndrome    Dr. Nelva Bush  . Mitral valve regurgitation, ischemic 06/04/2015   S/p repair in 2008     Past Surgical History:  Procedure Laterality Date  . CORONARY ARTERY BYPASS GRAFT  2008   MV REPLACEMENT  . INGUINAL HERNIA REPAIR Bilateral   . LUMBAR LAMINECTOMY    . RIGHT KNEE ARTHROSCOPY       Current Outpatient Medications  Medication Sig Dispense Refill  . amLODipine (NORVASC) 5 MG tablet Take 5 mg by mouth daily.    Marland Kitchen aspirin 81 MG tablet Take 81 mg by mouth daily.    . hydrochlorothiazide (HYDRODIURIL) 25 MG tablet Take 12.5 mg by mouth daily.     Marland Kitchen lisinopril (PRINIVIL,ZESTRIL) 20 MG tablet Take 20 mg by mouth daily.    . metoprolol succinate (TOPROL-XL) 25 MG 24 hr tablet Take 25 mg by mouth daily.    . nitroGLYCERIN (NITROSTAT) 0.4 MG SL tablet Place 1 tablet (0.4 mg total) under the tongue every 5 (five) minutes as needed for chest pain. 25 tablet 3  . Omega-3 Fatty Acids (FISH OIL) 1000 MG CAPS Take 1,000 mg by mouth 2 (  two) times daily. Gets from the [place].    . simvastatin (ZOCOR) 20 MG tablet Take 20 mg by mouth daily.    . tamsulosin (FLOMAX) 0.4 MG CAPS capsule Take 0.4 mg by mouth daily.    . temazepam (RESTORIL) 30 MG capsule Take 30 mg by mouth at bedtime as needed for sleep.     No current facility-administered medications for this visit.    Allergies:   Patient has no known allergies.    Social History:  The patient  reports that he has quit smoking. He has never used smokeless tobacco.   Family History:  The patient's family history includes Heart attack in his brother, father, and another family member; Heart disease in his son.    ROS:  Please see the history of  present illness.   Otherwise, review of systems are positive for rhinorrhea.   All other systems are reviewed and negative.    PHYSICAL EXAM: VS:  BP (!) 114/50   Pulse 64   Ht 6\' 3"  (1.905 m)   Wt 218 lb 6.4 oz (99.1 kg)   SpO2 94%   BMI 27.30 kg/m  , BMI Body mass index is 27.3 kg/m. GEN: Well nourished, well developed, in no acute distress  HEENT: normal  Neck: no JVD, carotid bruits, or masses Cardiac: RRR; no murmurs, rubs, or gallops,; bilateral leg edema L>R- chronic Respiratory:  clear to auscultation bilaterally, normal work of breathing GI: soft, nontender, nondistended, + BS MS: no deformity or atrophy  Skin: warm and dry, no rash Neuro:  Strength and sensation are intact Psych: euthymic mood, full affect   EKG:   The ekg ordered today demonstrates NSR, prolonged PR interval, RBBB   Recent Labs: No results found for requested labs within last 8760 hours.   Lipid Panel    Component Value Date/Time   CHOL  05/30/2007 1837    154        ATP III CLASSIFICATION:  <200     mg/dL   Desirable  200-239  mg/dL   Borderline High  >=240    mg/dL   High   TRIG 65 05/30/2007 1837   HDL 52 05/30/2007 1837   CHOLHDL 3.0 05/30/2007 1837   VLDL 13 05/30/2007 1837   LDLCALC  05/30/2007 1837    89        Total Cholesterol/HDL:CHD Risk Coronary Heart Disease Risk Table                     Men   Women  1/2 Average Risk   3.4   3.3     Other studies Reviewed: Additional studies/ records that were reviewed today with results demonstrating: labs reviewed.   ASSESSMENT AND PLAN:  1. CAD: No angina.  Continue aggressive secondary prevention.  No NTG use.  2. Hyperlipidemia: lipids controlled in July 2021. Continue simvastatin.  3. PreDM: A1C 6.7 in 2021, 6.2 in 2019.  Decrease sugary drinks.  Drink more water. 4. OK to get COVID vaccine booster. 5. Leg edema: Chronic since back surgery.  Can elevate legs to help.  6. Gustatory rhinitis.  No food allergies that he is  aware of.  Will check with Pharm D if any of his meds could cause this.    Current medicines are reviewed at length with the patient today.  The patient concerns regarding his medicines were addressed.  The following changes have been made:  No change  Labs/ tests ordered today include:  No orders of the defined types were placed in this encounter.   Recommend 150 minutes/week of aerobic exercise safely, avoid falls Low fat, low carb, high fiber diet recommended  Disposition:   FU in 1 year   Signed, Larae Grooms, MD  07/17/2020 9:51 AM    [facility] [place], [place], [place]  [address] Phone: [phone]; Fax: [phone]

## 2021-03-11 DIAGNOSIS — H25811 Combined forms of age-related cataract, right eye: Secondary | ICD-10-CM | POA: Diagnosis not present

## 2021-03-11 DIAGNOSIS — H26492 Other secondary cataract, left eye: Secondary | ICD-10-CM | POA: Diagnosis not present

## 2021-03-11 DIAGNOSIS — H04123 Dry eye syndrome of bilateral lacrimal glands: Secondary | ICD-10-CM | POA: Diagnosis not present

## 2021-03-11 DIAGNOSIS — D3131 Benign neoplasm of right choroid: Secondary | ICD-10-CM | POA: Diagnosis not present

## 2021-03-26 DIAGNOSIS — R319 Hematuria, unspecified: Secondary | ICD-10-CM | POA: Diagnosis not present

## 2021-03-26 DIAGNOSIS — R5383 Other fatigue: Secondary | ICD-10-CM | POA: Diagnosis not present

## 2021-03-26 DIAGNOSIS — R2689 Other abnormalities of gait and mobility: Secondary | ICD-10-CM | POA: Diagnosis not present

## 2021-03-26 DIAGNOSIS — T148XXA Other injury of unspecified body region, initial encounter: Secondary | ICD-10-CM | POA: Diagnosis not present

## 2021-04-01 DIAGNOSIS — R5383 Other fatigue: Secondary | ICD-10-CM | POA: Diagnosis not present

## 2021-04-01 DIAGNOSIS — I4891 Unspecified atrial fibrillation: Secondary | ICD-10-CM | POA: Diagnosis not present

## 2021-04-01 DIAGNOSIS — Z87891 Personal history of nicotine dependence: Secondary | ICD-10-CM | POA: Diagnosis not present

## 2021-04-01 DIAGNOSIS — R319 Hematuria, unspecified: Secondary | ICD-10-CM | POA: Diagnosis not present

## 2021-04-01 DIAGNOSIS — Z9181 History of falling: Secondary | ICD-10-CM | POA: Diagnosis not present

## 2021-04-01 DIAGNOSIS — M5136 Other intervertebral disc degeneration, lumbar region: Secondary | ICD-10-CM | POA: Diagnosis not present

## 2021-04-01 DIAGNOSIS — R2681 Unsteadiness on feet: Secondary | ICD-10-CM | POA: Diagnosis not present

## 2021-04-01 DIAGNOSIS — I2510 Atherosclerotic heart disease of native coronary artery without angina pectoris: Secondary | ICD-10-CM | POA: Diagnosis not present

## 2021-04-01 DIAGNOSIS — Z7982 Long term (current) use of aspirin: Secondary | ICD-10-CM | POA: Diagnosis not present

## 2021-04-03 DIAGNOSIS — R5383 Other fatigue: Secondary | ICD-10-CM | POA: Diagnosis not present

## 2021-04-03 DIAGNOSIS — Z7982 Long term (current) use of aspirin: Secondary | ICD-10-CM | POA: Diagnosis not present

## 2021-04-03 DIAGNOSIS — R319 Hematuria, unspecified: Secondary | ICD-10-CM | POA: Diagnosis not present

## 2021-04-03 DIAGNOSIS — I4891 Unspecified atrial fibrillation: Secondary | ICD-10-CM | POA: Diagnosis not present

## 2021-04-03 DIAGNOSIS — Z87891 Personal history of nicotine dependence: Secondary | ICD-10-CM | POA: Diagnosis not present

## 2021-04-03 DIAGNOSIS — Z9181 History of falling: Secondary | ICD-10-CM | POA: Diagnosis not present

## 2021-04-03 DIAGNOSIS — M5136 Other intervertebral disc degeneration, lumbar region: Secondary | ICD-10-CM | POA: Diagnosis not present

## 2021-04-03 DIAGNOSIS — I2510 Atherosclerotic heart disease of native coronary artery without angina pectoris: Secondary | ICD-10-CM | POA: Diagnosis not present

## 2021-04-03 DIAGNOSIS — R2681 Unsteadiness on feet: Secondary | ICD-10-CM | POA: Diagnosis not present

## 2021-04-06 DIAGNOSIS — Z9181 History of falling: Secondary | ICD-10-CM | POA: Diagnosis not present

## 2021-04-06 DIAGNOSIS — I2510 Atherosclerotic heart disease of native coronary artery without angina pectoris: Secondary | ICD-10-CM | POA: Diagnosis not present

## 2021-04-06 DIAGNOSIS — Z87891 Personal history of nicotine dependence: Secondary | ICD-10-CM | POA: Diagnosis not present

## 2021-04-06 DIAGNOSIS — R2681 Unsteadiness on feet: Secondary | ICD-10-CM | POA: Diagnosis not present

## 2021-04-06 DIAGNOSIS — Z7982 Long term (current) use of aspirin: Secondary | ICD-10-CM | POA: Diagnosis not present

## 2021-04-06 DIAGNOSIS — R319 Hematuria, unspecified: Secondary | ICD-10-CM | POA: Diagnosis not present

## 2021-04-06 DIAGNOSIS — R5383 Other fatigue: Secondary | ICD-10-CM | POA: Diagnosis not present

## 2021-04-06 DIAGNOSIS — M5136 Other intervertebral disc degeneration, lumbar region: Secondary | ICD-10-CM | POA: Diagnosis not present

## 2021-04-06 DIAGNOSIS — I4891 Unspecified atrial fibrillation: Secondary | ICD-10-CM | POA: Diagnosis not present

## 2021-04-09 DIAGNOSIS — R2681 Unsteadiness on feet: Secondary | ICD-10-CM | POA: Diagnosis not present

## 2021-04-09 DIAGNOSIS — R319 Hematuria, unspecified: Secondary | ICD-10-CM | POA: Diagnosis not present

## 2021-04-09 DIAGNOSIS — I4891 Unspecified atrial fibrillation: Secondary | ICD-10-CM | POA: Diagnosis not present

## 2021-04-09 DIAGNOSIS — R5383 Other fatigue: Secondary | ICD-10-CM | POA: Diagnosis not present

## 2021-04-09 DIAGNOSIS — M5136 Other intervertebral disc degeneration, lumbar region: Secondary | ICD-10-CM | POA: Diagnosis not present

## 2021-04-09 DIAGNOSIS — Z7982 Long term (current) use of aspirin: Secondary | ICD-10-CM | POA: Diagnosis not present

## 2021-04-09 DIAGNOSIS — I2510 Atherosclerotic heart disease of native coronary artery without angina pectoris: Secondary | ICD-10-CM | POA: Diagnosis not present

## 2021-04-09 DIAGNOSIS — Z9181 History of falling: Secondary | ICD-10-CM | POA: Diagnosis not present

## 2021-04-09 DIAGNOSIS — Z87891 Personal history of nicotine dependence: Secondary | ICD-10-CM | POA: Diagnosis not present

## 2021-04-13 DIAGNOSIS — R319 Hematuria, unspecified: Secondary | ICD-10-CM | POA: Diagnosis not present

## 2021-04-13 DIAGNOSIS — Z87891 Personal history of nicotine dependence: Secondary | ICD-10-CM | POA: Diagnosis not present

## 2021-04-13 DIAGNOSIS — Z7982 Long term (current) use of aspirin: Secondary | ICD-10-CM | POA: Diagnosis not present

## 2021-04-13 DIAGNOSIS — R5383 Other fatigue: Secondary | ICD-10-CM | POA: Diagnosis not present

## 2021-04-13 DIAGNOSIS — R2681 Unsteadiness on feet: Secondary | ICD-10-CM | POA: Diagnosis not present

## 2021-04-13 DIAGNOSIS — I2510 Atherosclerotic heart disease of native coronary artery without angina pectoris: Secondary | ICD-10-CM | POA: Diagnosis not present

## 2021-04-13 DIAGNOSIS — M5136 Other intervertebral disc degeneration, lumbar region: Secondary | ICD-10-CM | POA: Diagnosis not present

## 2021-04-13 DIAGNOSIS — I4891 Unspecified atrial fibrillation: Secondary | ICD-10-CM | POA: Diagnosis not present

## 2021-04-13 DIAGNOSIS — Z9181 History of falling: Secondary | ICD-10-CM | POA: Diagnosis not present

## 2021-04-15 DIAGNOSIS — Z9181 History of falling: Secondary | ICD-10-CM | POA: Diagnosis not present

## 2021-04-15 DIAGNOSIS — R2681 Unsteadiness on feet: Secondary | ICD-10-CM | POA: Diagnosis not present

## 2021-04-15 DIAGNOSIS — I2510 Atherosclerotic heart disease of native coronary artery without angina pectoris: Secondary | ICD-10-CM | POA: Diagnosis not present

## 2021-04-15 DIAGNOSIS — Z87891 Personal history of nicotine dependence: Secondary | ICD-10-CM | POA: Diagnosis not present

## 2021-04-15 DIAGNOSIS — I4891 Unspecified atrial fibrillation: Secondary | ICD-10-CM | POA: Diagnosis not present

## 2021-04-15 DIAGNOSIS — R319 Hematuria, unspecified: Secondary | ICD-10-CM | POA: Diagnosis not present

## 2021-04-15 DIAGNOSIS — M5136 Other intervertebral disc degeneration, lumbar region: Secondary | ICD-10-CM | POA: Diagnosis not present

## 2021-04-15 DIAGNOSIS — R5383 Other fatigue: Secondary | ICD-10-CM | POA: Diagnosis not present

## 2021-04-15 DIAGNOSIS — Z7982 Long term (current) use of aspirin: Secondary | ICD-10-CM | POA: Diagnosis not present

## 2021-04-20 DIAGNOSIS — R319 Hematuria, unspecified: Secondary | ICD-10-CM | POA: Diagnosis not present

## 2021-04-20 DIAGNOSIS — Z9181 History of falling: Secondary | ICD-10-CM | POA: Diagnosis not present

## 2021-04-20 DIAGNOSIS — M5136 Other intervertebral disc degeneration, lumbar region: Secondary | ICD-10-CM | POA: Diagnosis not present

## 2021-04-20 DIAGNOSIS — Z87891 Personal history of nicotine dependence: Secondary | ICD-10-CM | POA: Diagnosis not present

## 2021-04-20 DIAGNOSIS — Z7982 Long term (current) use of aspirin: Secondary | ICD-10-CM | POA: Diagnosis not present

## 2021-04-20 DIAGNOSIS — I2510 Atherosclerotic heart disease of native coronary artery without angina pectoris: Secondary | ICD-10-CM | POA: Diagnosis not present

## 2021-04-20 DIAGNOSIS — I4891 Unspecified atrial fibrillation: Secondary | ICD-10-CM | POA: Diagnosis not present

## 2021-04-20 DIAGNOSIS — R5383 Other fatigue: Secondary | ICD-10-CM | POA: Diagnosis not present

## 2021-04-20 DIAGNOSIS — R2681 Unsteadiness on feet: Secondary | ICD-10-CM | POA: Diagnosis not present

## 2021-04-23 DIAGNOSIS — Z7982 Long term (current) use of aspirin: Secondary | ICD-10-CM | POA: Diagnosis not present

## 2021-04-23 DIAGNOSIS — M5136 Other intervertebral disc degeneration, lumbar region: Secondary | ICD-10-CM | POA: Diagnosis not present

## 2021-04-23 DIAGNOSIS — I4891 Unspecified atrial fibrillation: Secondary | ICD-10-CM | POA: Diagnosis not present

## 2021-04-23 DIAGNOSIS — Z87891 Personal history of nicotine dependence: Secondary | ICD-10-CM | POA: Diagnosis not present

## 2021-04-23 DIAGNOSIS — R2681 Unsteadiness on feet: Secondary | ICD-10-CM | POA: Diagnosis not present

## 2021-04-23 DIAGNOSIS — I2510 Atherosclerotic heart disease of native coronary artery without angina pectoris: Secondary | ICD-10-CM | POA: Diagnosis not present

## 2021-04-23 DIAGNOSIS — R319 Hematuria, unspecified: Secondary | ICD-10-CM | POA: Diagnosis not present

## 2021-04-23 DIAGNOSIS — Z9181 History of falling: Secondary | ICD-10-CM | POA: Diagnosis not present

## 2021-04-23 DIAGNOSIS — R5383 Other fatigue: Secondary | ICD-10-CM | POA: Diagnosis not present

## 2021-08-01 ENCOUNTER — Emergency Department (HOSPITAL_BASED_OUTPATIENT_CLINIC_OR_DEPARTMENT_OTHER): Payer: Medicare HMO

## 2021-08-01 ENCOUNTER — Inpatient Hospital Stay (HOSPITAL_BASED_OUTPATIENT_CLINIC_OR_DEPARTMENT_OTHER)
Admission: EM | Admit: 2021-08-01 | Discharge: 2021-08-05 | DRG: 682 | Disposition: A | Discharge status: Home / Self Care | Payer: Medicare HMO | Attending: Student | Admitting: Student

## 2021-08-01 ENCOUNTER — Other Ambulatory Visit: Payer: Self-pay

## 2021-08-01 ENCOUNTER — Encounter (HOSPITAL_BASED_OUTPATIENT_CLINIC_OR_DEPARTMENT_OTHER): Payer: Self-pay | Admitting: Emergency Medicine

## 2021-08-01 DIAGNOSIS — R4182 Altered mental status, unspecified: Secondary | ICD-10-CM | POA: Diagnosis not present

## 2021-08-01 DIAGNOSIS — Z66 Do not resuscitate: Secondary | ICD-10-CM | POA: Diagnosis not present

## 2021-08-01 DIAGNOSIS — Z79899 Other long term (current) drug therapy: Secondary | ICD-10-CM

## 2021-08-01 DIAGNOSIS — R0602 Shortness of breath: Secondary | ICD-10-CM | POA: Diagnosis not present

## 2021-08-01 DIAGNOSIS — I2510 Atherosclerotic heart disease of native coronary artery without angina pectoris: Secondary | ICD-10-CM | POA: Diagnosis not present

## 2021-08-01 DIAGNOSIS — Z20822 Contact with and (suspected) exposure to covid-19: Secondary | ICD-10-CM | POA: Diagnosis present

## 2021-08-01 DIAGNOSIS — I129 Hypertensive chronic kidney disease with stage 1 through stage 4 chronic kidney disease, or unspecified chronic kidney disease: Secondary | ICD-10-CM | POA: Diagnosis present

## 2021-08-01 DIAGNOSIS — R638 Other symptoms and signs concerning food and fluid intake: Secondary | ICD-10-CM | POA: Diagnosis not present

## 2021-08-01 DIAGNOSIS — Z87891 Personal history of nicotine dependence: Secondary | ICD-10-CM | POA: Diagnosis not present

## 2021-08-01 DIAGNOSIS — Z8249 Family history of ischemic heart disease and other diseases of the circulatory system: Secondary | ICD-10-CM | POA: Diagnosis not present

## 2021-08-01 DIAGNOSIS — Z7982 Long term (current) use of aspirin: Secondary | ICD-10-CM

## 2021-08-01 DIAGNOSIS — I10 Essential (primary) hypertension: Secondary | ICD-10-CM | POA: Diagnosis not present

## 2021-08-01 DIAGNOSIS — Z87440 Personal history of urinary (tract) infections: Secondary | ICD-10-CM

## 2021-08-01 DIAGNOSIS — E782 Mixed hyperlipidemia: Secondary | ICD-10-CM | POA: Diagnosis present

## 2021-08-01 DIAGNOSIS — R7989 Other specified abnormal findings of blood chemistry: Secondary | ICD-10-CM | POA: Diagnosis not present

## 2021-08-01 DIAGNOSIS — R059 Cough, unspecified: Secondary | ICD-10-CM | POA: Diagnosis not present

## 2021-08-01 DIAGNOSIS — N281 Cyst of kidney, acquired: Secondary | ICD-10-CM | POA: Diagnosis not present

## 2021-08-01 DIAGNOSIS — E860 Dehydration: Secondary | ICD-10-CM | POA: Diagnosis not present

## 2021-08-01 DIAGNOSIS — R5381 Other malaise: Secondary | ICD-10-CM | POA: Diagnosis present

## 2021-08-01 DIAGNOSIS — I480 Paroxysmal atrial fibrillation: Secondary | ICD-10-CM | POA: Diagnosis not present

## 2021-08-01 DIAGNOSIS — R6883 Chills (without fever): Secondary | ICD-10-CM | POA: Diagnosis not present

## 2021-08-01 DIAGNOSIS — N179 Acute kidney failure, unspecified: Principal | ICD-10-CM | POA: Diagnosis present

## 2021-08-01 DIAGNOSIS — J90 Pleural effusion, not elsewhere classified: Secondary | ICD-10-CM | POA: Diagnosis not present

## 2021-08-01 DIAGNOSIS — G9341 Metabolic encephalopathy: Secondary | ICD-10-CM | POA: Diagnosis not present

## 2021-08-01 DIAGNOSIS — Z951 Presence of aortocoronary bypass graft: Secondary | ICD-10-CM | POA: Diagnosis not present

## 2021-08-01 DIAGNOSIS — N289 Disorder of kidney and ureter, unspecified: Secondary | ICD-10-CM | POA: Diagnosis not present

## 2021-08-01 DIAGNOSIS — N1831 Chronic kidney disease, stage 3a: Secondary | ICD-10-CM | POA: Diagnosis not present

## 2021-08-01 DIAGNOSIS — I517 Cardiomegaly: Secondary | ICD-10-CM | POA: Diagnosis not present

## 2021-08-01 LAB — CBC WITH DIFFERENTIAL/PLATELET
Abs Immature Granulocytes: 0.05 10*3/uL (ref 0.00–0.07)
Basophils Absolute: 0 10*3/uL (ref 0.0–0.1)
Basophils Relative: 0 %
Eosinophils Absolute: 0 10*3/uL (ref 0.0–0.5)
Eosinophils Relative: 0 %
HCT: 36.4 % — ABNORMAL LOW (ref 39.0–52.0)
Hemoglobin: 11.9 g/dL — ABNORMAL LOW (ref 13.0–17.0)
Immature Granulocytes: 0 %
Lymphocytes Relative: 28 %
Lymphs Abs: 4 10*3/uL (ref 0.7–4.0)
MCH: 29.7 pg (ref 26.0–34.0)
MCHC: 32.7 g/dL (ref 30.0–36.0)
MCV: 90.8 fL (ref 80.0–100.0)
Monocytes Absolute: 1.6 10*3/uL — ABNORMAL HIGH (ref 0.1–1.0)
Monocytes Relative: 12 %
Neutro Abs: 8.6 10*3/uL — ABNORMAL HIGH (ref 1.7–7.7)
Neutrophils Relative %: 60 %
Platelets: 250 10*3/uL (ref 150–400)
RBC: 4.01 MIL/uL — ABNORMAL LOW (ref 4.22–5.81)
RDW: 14.3 % (ref 11.5–15.5)
WBC: 14.3 10*3/uL — ABNORMAL HIGH (ref 4.0–10.5)
nRBC: 0 % (ref 0.0–0.2)

## 2021-08-01 LAB — COMPREHENSIVE METABOLIC PANEL
ALT: 14 U/L (ref 0–44)
AST: 21 U/L (ref 15–41)
Albumin: 4.2 g/dL (ref 3.5–5.0)
Alkaline Phosphatase: 63 U/L (ref 38–126)
Anion gap: 15 (ref 5–15)
BUN: 96 mg/dL — ABNORMAL HIGH (ref 8–23)
CO2: 24 mmol/L (ref 22–32)
Calcium: 9 mg/dL (ref 8.9–10.3)
Chloride: 106 mmol/L (ref 98–111)
Creatinine, Ser: 3.94 mg/dL — ABNORMAL HIGH (ref 0.61–1.24)
GFR, Estimated: 13 mL/min — ABNORMAL LOW (ref >60)
Glucose, Bld: 123 mg/dL — ABNORMAL HIGH (ref 70–99)
Potassium: 4.2 mmol/L (ref 3.5–5.1)
Sodium: 145 mmol/L (ref 135–145)
Total Bilirubin: 0.6 mg/dL (ref 0.3–1.2)
Total Protein: 7.9 g/dL (ref 6.5–8.1)

## 2021-08-01 LAB — RESP PANEL BY RT-PCR (FLU A&B, COVID) ARPGX2
Influenza A by PCR: NEGATIVE
Influenza B by PCR: NEGATIVE
SARS Coronavirus 2 by RT PCR: NEGATIVE

## 2021-08-01 LAB — TROPONIN I (HIGH SENSITIVITY): Troponin I (High Sensitivity): 37 ng/L — ABNORMAL HIGH (ref <18)

## 2021-08-01 LAB — LACTIC ACID, PLASMA: Lactic Acid, Venous: 1.1 mmol/L (ref 0.5–1.9)

## 2021-08-01 LAB — BRAIN NATRIURETIC PEPTIDE: B Natriuretic Peptide: 241.6 pg/mL — ABNORMAL HIGH (ref 0.0–100.0)

## 2021-08-01 LAB — CBG MONITORING, ED: Glucose-Capillary: 109 mg/dL — ABNORMAL HIGH (ref 70–99)

## 2021-08-01 LAB — LIPASE, BLOOD: Lipase: 27 U/L (ref 11–51)

## 2021-08-01 MED ORDER — SODIUM CHLORIDE 0.9 % IV BOLUS: 1000.0000 mL | Freq: Once | INTRAVENOUS | Status: DC

## 2021-08-01 NOTE — ED Notes (Signed)
Assisted patient up to use the urinal. He was unable to void at this time. Sprite given and will attempt to collect UA again.

## 2021-08-01 NOTE — ED Triage Notes (Signed)
Altered mental status per family , chills , possible infection , Hx UTIs/ . Alert and oriented to person and place only.

## 2021-08-01 NOTE — ED Notes (Signed)
Patient transported to CT 

## 2021-08-01 NOTE — ED Notes (Signed)
Report given to [facility] at [facility] about patient's admission

## 2021-08-01 NOTE — ED Provider Notes (Signed)
[facility] EMERGENC[facility] DEPT Provider Note   CSN: [id number] Arrival date & time: 08/01/21  2131     History Chief Complaint  Patient presents with   Altered Mental Status    Terry [name] is a 87 y.o. male.  The history is provided by the patient.  Altered Mental Status Presenting symptoms: confusion   Severity:  Mild Most recent episode:  Yesterday Episode history:  Single Timing:  Intermittent Chronicity:  New Context: not recent infection   Associated symptoms: no abdominal pain, normal movement, no agitation, no bladder incontinence, no decreased appetite, no depression, no difficulty breathing, no eye deviation, no fever, no hallucinations, no headaches, no light-headedness, no nausea, no palpitations, no rash, no seizures, no slurred speech, no suicidal behavior, no visual change, no vomiting and no weakness       Past Medical History:  Diagnosis Date   Arrhythmia    atrial fib   Coronary artery disease    Coronary atherosclerosis 12/28/2013   Mitral valve annuloplasty repair for ischemic mitral regurgitation      with a 28 mm McCarthy-Adams ring.   Left atrial and right atrial MAZE procedure for preoperative atrial      flutter.   Stapling of left atrial appendage. Coronary artery bypass grafting x3 (left internal mammary artery to      left anterior descending, saphenous vein graft to circumflex      marginal, saphenous vein graft to    Edema 05/22/2014   Left ankle slightly larger than right-prior vein harvest    Essential hypertension, benign 12/28/2013   Hypertension    Low back syndrome    Dr. Nelva Bush   Mitral valve regurgitation, ischemic 06/04/2015   S/p repair in 2008     Patient Active Problem List   Diagnosis Date Noted   RBBB 06/10/2017   Edema 05/22/2014   Coronary atherosclerosis 12/28/2013   Essential hypertension, benign 12/28/2013   Mixed hyperlipidemia 12/28/2013    Past Surgical History:  Procedure Laterality Date   CORONARY  ARTERY BYPASS GRAFT  2008   MV REPLACEMENT   INGUINAL HERNIA REPAIR Bilateral    LUMBAR LAMINECTOMY     RIGHT KNEE ARTHROSCOPY         Family History  Problem Relation Age of Onset   Heart disease Son    Heart attack Father    Heart attack Brother    Heart attack Other     Social History   Tobacco Use   Smoking status: Former   Smokeless tobacco: Never    Home Medications Prior to Admission medications   Medication Sig Start Date End Date Taking? Authorizing Provider  amLODipine (NORVASC) 5 MG tablet Take 5 mg by mouth daily.    [provider]  aspirin 81 MG tablet Take 81 mg by mouth daily.    [provider]  hydrochlorothiazide (HYDRODIURIL) 25 MG tablet Take 12.5 mg by mouth daily.     [provider]  lisinopril (PRINIVIL,ZESTRIL) 20 MG tablet Take 20 mg by mouth daily.    [provider]  metoprolol succinate (TOPROL-XL) 25 MG 24 hr tablet Take 25 mg by mouth daily.    [provider]  nitroGLYCERIN (NITROSTAT) 0.4 MG SL tablet Place 1 tablet (0.4 mg total) under the tongue every 5 (five) minutes as needed for chest pain. 06/20/18   Jettie Booze, MD  Omega-3 Fatty Acids (FISH OIL) 1000 MG CAPS Take 1,000 mg by mouth 2 (two) times daily. [facility]    [provider]  simvastatin (ZOCOR) 20 MG tablet Take 20 mg by mouth daily.    [provider]  tamsulosin (FLOMAX) 0.4 MG CAPS capsule Take 0.4 mg by mouth daily. 03/14/20   [provider]  temazepam (RESTORIL) 30 MG capsule Take 30 mg by mouth at bedtime as needed for sleep.    [provider]    Allergies    Patient has no known allergies.  Review of Systems   Review of Systems  Constitutional:  Negative for chills, decreased appetite and fever.  HENT:  Negative for ear pain and sore throat.   Eyes:  Negative for pain and visual disturbance.  Respiratory:  Negative for cough and shortness of breath.   Cardiovascular:   Negative for chest pain and palpitations.  Gastrointestinal:  Negative for abdominal pain, nausea and vomiting.  Genitourinary:  Negative for bladder incontinence, dysuria and hematuria.  Musculoskeletal:  Negative for arthralgias and back pain.  Skin:  Negative for color change and rash.  Neurological:  Negative for seizures, syncope, weakness, light-headedness and headaches.  Psychiatric/Behavioral:  Positive for confusion. Negative for agitation and hallucinations.   All other systems reviewed and are negative.  Physical Exam Updated Vital Signs BP (!) 110/55   Pulse 69   Temp 97.8 F (36.6 C) (Rectal)   Resp (!) 21   Ht 6\' 2"  (1.88 m)   Wt 95.3 kg   SpO2 98%   BMI 26.96 kg/m   Physical Exam Vitals and nursing note reviewed.  Constitutional:      General: He is not in acute distress.    Appearance: He is well-developed. He is not ill-appearing.  HENT:     Head: Normocephalic and atraumatic.     Nose: Nose normal.     Mouth/Throat:     Mouth: Mucous membranes are moist.  Eyes:     Extraocular Movements: Extraocular movements intact.     Conjunctiva/sclera: Conjunctivae normal.     Pupils: Pupils are equal, round, and reactive to light.  Cardiovascular:     Rate and Rhythm: Normal rate and regular rhythm.     Pulses: Normal pulses.     Heart sounds: Normal heart sounds. No murmur heard. Pulmonary:     Effort: Pulmonary effort is normal. No respiratory distress.     Breath sounds: Normal breath sounds.  Abdominal:     Palpations: Abdomen is soft.     Tenderness: There is no abdominal tenderness.  Genitourinary:    Rectum: Guaiac result negative.  Musculoskeletal:        General: Normal range of motion.     Cervical back: Normal range of motion and neck supple.     Right lower leg: No edema.     Left lower leg: No edema.  Skin:    General: Skin is warm and dry.     Capillary Refill: Capillary refill takes less than 2 seconds.  Neurological:     General: No  focal deficit present.     Mental Status: He is alert and oriented to person, place, and time.     Cranial Nerves: No cranial nerve deficit.     Sensory: No sensory deficit.     Motor: No weakness.     Coordination: Coordination normal.     Comments: 5+/5 strength, normal sensation, normal speech, no drift  Psychiatric:        Mood and Affect: Mood normal.    ED Results / Procedures / Treatments  Labs (all labs ordered are listed, but only abnormal results are displayed) Labs Reviewed  CBC WITH DIFFERENTIAL/PLATELET - Abnormal; Notable for the following components:      Result Value   WBC 14.3 (*)    RBC 4.01 (*)    Hemoglobin 11.9 (*)    HCT 36.4 (*)    Neutro Abs 8.6 (*)    Monocytes Absolute 1.6 (*)    All other components within normal limits  CBG MONITORING, ED - Abnormal; Notable for the following components:   Glucose-Capillary 109 (*)    All other components within normal limits  URINE CULTURE  RESP PANEL BY RT-PCR (FLU A&B, COVID) ARPGX2  CULTURE, BLOOD (ROUTINE X 2)  CULTURE, BLOOD (ROUTINE X 2)  COMPREHENSIVE METABOLIC PANEL  LIPASE, BLOOD  URINALYSIS, ROUTINE W REFLEX MICROSCOPIC  LACTIC ACID, PLASMA  LACTIC ACID, PLASMA  BRAIN NATRIURETIC PEPTIDE  TROPONIN I (HIGH SENSITIVITY)    EKG EKG Interpretation  Date/Time:  Saturday August 01 2021 22:19:26 EDT Ventricular Rate:  72 PR Interval:  218 QRS Duration: 182 QT Interval:  422 QTC Calculation: 462 R Axis:   -27 Text Interpretation: Sinus or ectopic atrial rhythm Borderline prolonged PR interval Right bundle branch block Confirmed by Ronnald Nian, Savera Donson ([id number]) on 08/01/2021 10:21:46 PM  Radiology No results found.  Procedures Procedures   Medications Ordered in ED Medications - No data to display  ED Course  I have reviewed the triage vital signs and the nursing notes.  Pertinent labs & imaging results that were available during my care of the patient were reviewed by me and considered in my  medical decision making (see chart for details).    MDM Rules/Calculators/A&P                           Akshay C Solan is here for evaluation of some confusion.  History of coronary artery disease and CABG, hypertension.  Patient has overall unremarkable vitals.  Rectal temperature is normal.  Does have some mild increased work of breathing on exam and some coarse breath sounds throughout but otherwise exam is unremarkable.  Neurologically he is intact.  He is very pleasant.  He answers questions appropriately.  He denies any chest pain, shortness of breath, abdominal pain, nausea, vomiting.  Family concerned for UTIs which he has a history of.  He has no abdominal tenderness on exam.  Overall have no concern for stroke.  Could be possibly a cardiac process or infectious process.  Will get troponin.  EKG shows sinus rhythm with no obvious ischemic changes.  Patient will get infectious work-up including head CT.  Vital signs are not consistent with sepsis.  But will get blood cultures.  We will hold off on any antibiotics at this time.  Patient does have mild leukocytosis of 14.  No significant anemia.  Remaining lab work including imaging and urinalysis are pending.  Patient handed off to oncoming ED staff, please see their note for further results, evaluation, disposition of the patient.  This chart was dictated using voice recognition software.  Despite best efforts to proofread,  errors can occur which can change the documentation meaning.   Final Clinical Impression(s) / ED Diagnoses Final diagnoses:  Altered mental status, unspecified altered mental status type    Rx / DC Orders ED Discharge Orders     None        Lennice Sites, DO 08/01/21 2311

## 2021-08-02 ENCOUNTER — Inpatient Hospital Stay (HOSPITAL_COMMUNITY): Payer: Medicare HMO

## 2021-08-02 DIAGNOSIS — I2510 Atherosclerotic heart disease of native coronary artery without angina pectoris: Secondary | ICD-10-CM | POA: Diagnosis present

## 2021-08-02 DIAGNOSIS — Z79899 Other long term (current) drug therapy: Secondary | ICD-10-CM | POA: Diagnosis not present

## 2021-08-02 DIAGNOSIS — I10 Essential (primary) hypertension: Secondary | ICD-10-CM | POA: Diagnosis not present

## 2021-08-02 DIAGNOSIS — N281 Cyst of kidney, acquired: Secondary | ICD-10-CM | POA: Diagnosis not present

## 2021-08-02 DIAGNOSIS — Z66 Do not resuscitate: Secondary | ICD-10-CM | POA: Diagnosis present

## 2021-08-02 DIAGNOSIS — R638 Other symptoms and signs concerning food and fluid intake: Secondary | ICD-10-CM | POA: Diagnosis not present

## 2021-08-02 DIAGNOSIS — Z8249 Family history of ischemic heart disease and other diseases of the circulatory system: Secondary | ICD-10-CM | POA: Diagnosis not present

## 2021-08-02 DIAGNOSIS — I129 Hypertensive chronic kidney disease with stage 1 through stage 4 chronic kidney disease, or unspecified chronic kidney disease: Secondary | ICD-10-CM | POA: Diagnosis present

## 2021-08-02 DIAGNOSIS — I480 Paroxysmal atrial fibrillation: Secondary | ICD-10-CM | POA: Diagnosis present

## 2021-08-02 DIAGNOSIS — G9341 Metabolic encephalopathy: Secondary | ICD-10-CM | POA: Diagnosis present

## 2021-08-02 DIAGNOSIS — N1831 Chronic kidney disease, stage 3a: Secondary | ICD-10-CM | POA: Diagnosis present

## 2021-08-02 DIAGNOSIS — E860 Dehydration: Secondary | ICD-10-CM | POA: Diagnosis present

## 2021-08-02 DIAGNOSIS — R7989 Other specified abnormal findings of blood chemistry: Secondary | ICD-10-CM | POA: Diagnosis not present

## 2021-08-02 DIAGNOSIS — N289 Disorder of kidney and ureter, unspecified: Secondary | ICD-10-CM | POA: Diagnosis not present

## 2021-08-02 DIAGNOSIS — R0602 Shortness of breath: Secondary | ICD-10-CM | POA: Diagnosis not present

## 2021-08-02 DIAGNOSIS — R5381 Other malaise: Secondary | ICD-10-CM | POA: Diagnosis not present

## 2021-08-02 DIAGNOSIS — N179 Acute kidney failure, unspecified: Secondary | ICD-10-CM | POA: Diagnosis present

## 2021-08-02 DIAGNOSIS — Z87891 Personal history of nicotine dependence: Secondary | ICD-10-CM | POA: Diagnosis not present

## 2021-08-02 DIAGNOSIS — E782 Mixed hyperlipidemia: Secondary | ICD-10-CM | POA: Diagnosis present

## 2021-08-02 DIAGNOSIS — I517 Cardiomegaly: Secondary | ICD-10-CM | POA: Diagnosis not present

## 2021-08-02 DIAGNOSIS — Z87440 Personal history of urinary (tract) infections: Secondary | ICD-10-CM | POA: Diagnosis not present

## 2021-08-02 DIAGNOSIS — J90 Pleural effusion, not elsewhere classified: Secondary | ICD-10-CM | POA: Diagnosis not present

## 2021-08-02 DIAGNOSIS — Z951 Presence of aortocoronary bypass graft: Secondary | ICD-10-CM | POA: Diagnosis not present

## 2021-08-02 DIAGNOSIS — R4182 Altered mental status, unspecified: Secondary | ICD-10-CM | POA: Diagnosis present

## 2021-08-02 DIAGNOSIS — Z7982 Long term (current) use of aspirin: Secondary | ICD-10-CM | POA: Diagnosis not present

## 2021-08-02 DIAGNOSIS — Z20822 Contact with and (suspected) exposure to covid-19: Secondary | ICD-10-CM | POA: Diagnosis present

## 2021-08-02 LAB — SODIUM, URINE, RANDOM: Sodium, Ur: 77 mmol/L

## 2021-08-02 LAB — URINALYSIS, ROUTINE W REFLEX MICROSCOPIC
Bilirubin Urine: NEGATIVE
Glucose, UA: NEGATIVE mg/dL
Hgb urine dipstick: NEGATIVE
Ketones, ur: NEGATIVE mg/dL
Leukocytes,Ua: NEGATIVE
Nitrite: NEGATIVE
Protein, ur: NEGATIVE mg/dL
Specific Gravity, Urine: 1.019 (ref 1.005–1.030)
pH: 5 (ref 5.0–8.0)

## 2021-08-02 LAB — BASIC METABOLIC PANEL
Anion gap: 10 (ref 5–15)
BUN: 89 mg/dL — ABNORMAL HIGH (ref 8–23)
CO2: 26 mmol/L (ref 22–32)
Calcium: 8.7 mg/dL — ABNORMAL LOW (ref 8.9–10.3)
Chloride: 102 mmol/L (ref 98–111)
Creatinine, Ser: 2.75 mg/dL — ABNORMAL HIGH (ref 0.61–1.24)
GFR, Estimated: 21 mL/min — ABNORMAL LOW (ref >60)
Glucose, Bld: 117 mg/dL — ABNORMAL HIGH (ref 70–99)
Potassium: 4.3 mmol/L (ref 3.5–5.1)
Sodium: 138 mmol/L (ref 135–145)

## 2021-08-02 LAB — TROPONIN I (HIGH SENSITIVITY): Troponin I (High Sensitivity): 35 ng/L — ABNORMAL HIGH (ref <18)

## 2021-08-02 LAB — CREATININE, URINE, RANDOM: Creatinine, Urine: 144.17 mg/dL

## 2021-08-02 LAB — MRSA NEXT GEN BY PCR, NASAL: MRSA by PCR Next Gen: NOT DETECTED

## 2021-08-02 MED ORDER — IPRATROPIUM BROMIDE 0.03 % NA SOLN: 2.0000 | Freq: Two times a day (BID) | NASAL | Status: DC | PRN

## 2021-08-02 MED ORDER — TEMAZEPAM 15 MG PO CAPS
30.0000 mg | ORAL_CAPSULE | Freq: Every evening | ORAL | Status: DC | PRN
  Administered 2021-08-02 – 2021-08-04 (×3): 30 mg via ORAL
  Filled 2021-08-02 (×3): qty 2

## 2021-08-02 MED ORDER — DOCUSATE SODIUM 100 MG PO CAPS
100.0000 mg | ORAL_CAPSULE | Freq: Two times a day (BID) | ORAL | Status: DC
  Administered 2021-08-02 – 2021-08-05 (×6): 100 mg via ORAL
  Filled 2021-08-02 (×6): qty 1

## 2021-08-02 MED ORDER — ONDANSETRON HCL 4 MG PO TABS: 4.0000 mg | ORAL_TABLET | Freq: Four times a day (QID) | ORAL | Status: DC | PRN

## 2021-08-02 MED ORDER — ACETAMINOPHEN 650 MG RE SUPP: 650.0000 mg | Freq: Four times a day (QID) | RECTAL | Status: DC | PRN

## 2021-08-02 MED ORDER — MELATONIN 3 MG PO TABS
3.0000 mg | ORAL_TABLET | Freq: Every evening | ORAL | Status: DC | PRN
  Administered 2021-08-02 – 2021-08-04 (×3): 3 mg via ORAL
  Filled 2021-08-02 (×3): qty 1

## 2021-08-02 MED ORDER — ASPIRIN EC 81 MG PO TBEC
81.0000 mg | DELAYED_RELEASE_TABLET | Freq: Every day | ORAL | Status: DC
  Filled 2021-08-02: qty 1

## 2021-08-02 MED ORDER — LACTATED RINGERS IV SOLN: INTRAVENOUS | Status: DC

## 2021-08-02 MED ORDER — METOPROLOL SUCCINATE ER 25 MG PO TB24
25.0000 mg | ORAL_TABLET | Freq: Every day | ORAL | Status: DC
  Administered 2021-08-03 – 2021-08-05 (×3): 25 mg via ORAL
  Filled 2021-08-02 (×3): qty 1

## 2021-08-02 MED ORDER — POLYETHYLENE GLYCOL 3350 17 G PO PACK: 17.0000 g | PACK | Freq: Every day | ORAL | Status: DC | PRN

## 2021-08-02 MED ORDER — SIMVASTATIN 20 MG PO TABS
20.0000 mg | ORAL_TABLET | Freq: Every day | ORAL | Status: DC
  Administered 2021-08-02 – 2021-08-04 (×3): 20 mg via ORAL
  Filled 2021-08-02 (×3): qty 1

## 2021-08-02 MED ORDER — SIMVASTATIN 20 MG PO TABS: 20.0000 mg | ORAL_TABLET | Freq: Every day | ORAL | Status: DC

## 2021-08-02 MED ORDER — BISACODYL 5 MG PO TBEC: 5.0000 mg | DELAYED_RELEASE_TABLET | Freq: Every day | ORAL | Status: DC | PRN

## 2021-08-02 MED ORDER — HEPARIN SODIUM (PORCINE) 5000 UNIT/ML IJ SOLN
5000.0000 [IU] | Freq: Three times a day (TID) | INTRAMUSCULAR | Status: DC
  Administered 2021-08-02 – 2021-08-05 (×8): 5000 [IU] via SUBCUTANEOUS
  Filled 2021-08-02 (×8): qty 1

## 2021-08-02 MED ORDER — IPRATROPIUM BROMIDE 0.06 % NA SOLN: 1.0000 | Freq: Two times a day (BID) | NASAL | Status: DC | PRN

## 2021-08-02 MED ORDER — ACETAMINOPHEN 325 MG PO TABS: 650.0000 mg | ORAL_TABLET | Freq: Four times a day (QID) | ORAL | Status: DC | PRN

## 2021-08-02 MED ORDER — TAMSULOSIN HCL 0.4 MG PO CAPS
0.4000 mg | ORAL_CAPSULE | Freq: Every day | ORAL | Status: DC
  Administered 2021-08-02 – 2021-08-05 (×4): 0.4 mg via ORAL
  Filled 2021-08-02 (×4): qty 1

## 2021-08-02 MED ORDER — ONDANSETRON HCL 4 MG/2ML IJ SOLN: 4.0000 mg | Freq: Four times a day (QID) | INTRAMUSCULAR | Status: DC | PRN

## 2021-08-02 MED ORDER — ASPIRIN EC 81 MG PO TBEC
81.0000 mg | DELAYED_RELEASE_TABLET | Freq: Every day | ORAL | Status: DC
  Administered 2021-08-02 – 2021-08-05 (×4): 81 mg via ORAL
  Filled 2021-08-02 (×4): qty 1

## 2021-08-02 MED ORDER — HYDROCODONE-ACETAMINOPHEN 5-325 MG PO TABS: 1.0000 | ORAL_TABLET | ORAL | Status: DC | PRN

## 2021-08-02 MED ORDER — HYDRALAZINE HCL 20 MG/ML IJ SOLN: 5.0000 mg | INTRAMUSCULAR | Status: DC | PRN

## 2021-08-02 NOTE — ED Notes (Addendum)
[name] would like to be contacted with room assignment @ [phone].

## 2021-08-02 NOTE — Assessment & Plan Note (Addendum)
Patient with baseline stage 3 CKD in 2020 Serum creatinine baseline 1.36.  On presentation serum creatinine 3.94.  BUN was also elevated.  Likely uremia causing acute encephalopathy. Received IV hydration.  With improvement in serum creatinine. Given the worsening respiratory status.  I will be holding IV fluids.  Renal function still not back to baseline. US renal negative for any acute obstruction or stone. Likely prerenal etiology in the setting of diarrhea that the patient had a few days ago as well as ongoing use of diuretics.

## 2021-08-02 NOTE — ED Notes (Signed)
Report called to [facility], RN 4np 

## 2021-08-02 NOTE — H&P (Addendum)
History and Physical    Terry [name] WIO:[id number] DOB: 09/23/1924 DOA: 08/01/2021  PCP: Alroy Dust, L.Marlou Sa, MD Consultants:  Irish Lack - cardiology Patient coming from:  Home - lives alone and has someone come in 3 times per week; NOK: [place], [place], [phone]  Chief Complaint: Confusion  HPI: Terry [name] is a 87 y.o. male with medical history significant of CAD; HTN; and afib presenting with confusion. His son made him come to the hospital.  He feels fine.  He is eating and drinking well.  He has no complaints other than wanting to eat.    His son reports that he is adamant about staying home.  Family alternates with caregivers.  Thursday, he was talking weird.  Friday, it continued and his caregiver reported that the heat was set at 90 and his gas logs were flaring.  He was sweating and wearing a sweatshirt.  Family brought him in yesterday and he was still mildly confused.  No h/o kidney failure.  He did have a UTI in 2020.  He also has chronic rhinorrhea, seemed to have more congestion - family suggested CHF as a cause.  He does not like nocturia and so limits his fluids.    ED Course: [facility] transfer, per Dr. Tonie Griffith:  87 yo male with hx of HTN, CAD, PAF who presented with confusion. Son concerned for infection as has had confusion with UTI in past. UA negative, CXR negative. Head CT no acute process. Has AKI on labs.  Review of Systems: As per HPI; otherwise review of systems reviewed and negative.   Ambulatory Status:  Ambulates with a cane  COVID Vaccine Status:  Complete?  Past Medical History:  Diagnosis Date   Arrhythmia    atrial fib   Coronary artery disease    Coronary atherosclerosis 12/28/2013   Mitral valve annuloplasty repair for ischemic mitral regurgitation      with a 28 mm McCarthy-Adams ring.   Left atrial and right atrial MAZE procedure for preoperative atrial      flutter.   Stapling of left atrial appendage. Coronary artery bypass grafting x3  (left internal mammary artery to      left anterior descending, saphenous vein graft to circumflex      marginal, saphenous vein graft to    Edema 05/22/2014   Left ankle slightly larger than right-prior vein harvest    Essential hypertension, benign 12/28/2013   Hypertension    Low back syndrome    Dr. Nelva Bush   Mitral valve regurgitation, ischemic 06/04/2015   S/p repair in 2008     Past Surgical History:  Procedure Laterality Date   CORONARY ARTERY BYPASS GRAFT  2008   MV REPLACEMENT   INGUINAL HERNIA REPAIR Bilateral    LUMBAR LAMINECTOMY     RIGHT KNEE ARTHROSCOPY      Social History   Socioeconomic History   Marital status: Married    Spouse name: Not on file   Number of children: Not on file   Years of education: Not on file   Highest education level: Not on file  Occupational History   Not on file  Tobacco Use   Smoking status: Former   Smokeless tobacco: Never  Substance and Sexual Activity   Alcohol use: Not on file   Drug use: Not on file   Sexual activity: Not on file  Other Topics Concern   Not on file  Social History Narrative   Not on file   Social  Determinants of Health   Financial Resource Strain: Not on file  Food Insecurity: Not on file  Transportation Needs: Not on file  Physical Activity: Not on file  Stress: Not on file  Social Connections: Not on file  Intimate Partner Violence: Not on file    No Known Allergies  Family History  Problem Relation Age of Onset   Heart disease Son    Heart attack Father    Heart attack Brother    Heart attack Other     Prior to Admission medications   Medication Sig Start Date End Date Taking? Authorizing Provider  amLODipine (NORVASC) 5 MG tablet Take 5 mg by mouth daily.    [provider]  aspirin 81 MG tablet Take 81 mg by mouth daily.    [provider]  hydrochlorothiazide (HYDRODIURIL) 25 MG tablet Take 12.5 mg by mouth daily.     [provider]  lisinopril  (PRINIVIL,ZESTRIL) 20 MG tablet Take 20 mg by mouth daily.    [provider]  metoprolol succinate (TOPROL-XL) 25 MG 24 hr tablet Take 25 mg by mouth daily.    [provider]  nitroGLYCERIN (NITROSTAT) 0.4 MG SL tablet Place 1 tablet (0.4 mg total) under the tongue every 5 (five) minutes as needed for chest pain. 06/20/18   Jettie Booze, MD  Omega-3 Fatty Acids (FISH OIL) 1000 MG CAPS Take 1,000 mg by mouth 2 (two) times daily. Gets from the [place].    [provider]  simvastatin (ZOCOR) 20 MG tablet Take 20 mg by mouth daily.    [provider]  tamsulosin (FLOMAX) 0.4 MG CAPS capsule Take 0.4 mg by mouth daily. 03/14/20   [provider]  temazepam (RESTORIL) 30 MG capsule Take 30 mg by mouth at bedtime as needed for sleep.    [provider]    Physical Exam: Vitals:   08/02/21 0609 08/02/21 0915 08/02/21 0930 08/02/21 1023  BP: (!) 102/54 (!) 107/53 (!) 113/52 (!) 111/50  Pulse: 86 64 78 72  Resp: (!) 26 (!) 26 20 20   Temp:    97.6 F (36.4 C)  TempSrc:    Axillary  SpO2: 96% 97% 98% 98%  Weight:    86.7 kg  Height:    6\' 1"  (1.854 m)     General:  Appears calm and comfortable and is in NAD, mildly confused vs. Unable to hear questions (or both) Eyes:   EOMI, normal lids, iris ENT:  hard of hearing, grossly normal lips & tongue, mmm Neck:  no LAD, masses or thyromegaly Cardiovascular:  RRR, no m/r/g. No LE edema.  Respiratory:   CTA bilaterally with no wheezes/rales/rhonchi.  Normal respiratory effort. Abdomen:  soft, NT, ND Skin:  no rash or induration seen on limited exam Musculoskeletal:  grossly normal tone BUE/BLE, good ROM, no bony abnormality Lower extremity:  No LE edema.  Limited foot exam with no ulcerations.  2+ distal pulses. Psychiatric:  grossly normal mood and affect, speech fluent and appropriate but mildly inappropriate Neurologic:  CN 2-12 grossly intact, moves all extremities in coordinated  fashion    Radiological Exams on Admission: Independently reviewed - see discussion in A/P where applicable  CT Head Wo Contrast  Result Date: 08/01/2021 CLINICAL DATA:  Altered mental status. EXAM: CT HEAD WITHOUT CONTRAST TECHNIQUE: Contiguous axial images were obtained from the base of the skull through the vertex without intravenous contrast. COMPARISON:  None. BRAIN: BRAIN Cerebral ventricle sizes are concordant with the  degree of cerebral volume loss. Patchy and confluent areas of decreased attenuation are noted throughout the deep and periventricular white matter of the cerebral hemispheres bilaterally, compatible with chronic microvascular ischemic disease. No evidence of large-territorial acute infarction. No parenchymal hemorrhage. No mass lesion. No extra-axial collection. No mass effect or midline shift. No hydrocephalus. Basilar cisterns are patent. Vascular: No hyperdense vessel. Atherosclerotic calcifications are present within the cavernous internal carotid and vertebral arteries. Skull: No acute fracture or focal lesion. Sinuses/Orbits: Paranasal sinuses and mastoid air cells are clear. Left lens replacement. Otherwise the orbits are unremarkable. Other: None. IMPRESSION: No acute intracranial abnormality. Electronically Signed   By: Iven Finn M.D.   On: 08/01/2021 23:15   DG Chest Portable 1 View  Result Date: 08/01/2021 CLINICAL DATA:  Cough. Altered mental status. Chills. Possible infection. EXAM: PORTABLE CHEST 1 VIEW COMPARISON:  11/20/2007 FINDINGS: Postoperative changes in the mediastinum. Heart size and pulmonary vascularity are normal. Linear atelectasis or fibrosis in the lung bases is unchanged since prior study. No airspace disease or consolidation. No pleural effusions. No pneumothorax. Mediastinal contours appear intact. Calcification of the aorta. Degenerative changes in the spine and shoulders. IMPRESSION: No evidence of active pulmonary disease. Linear fibrosis or  atelectasis in the lung bases. Electronically Signed   By: Lucienne Capers M.D.   On: 08/01/2021 23:19    EKG: Independently reviewed.  NSR with rate 72; RBBB, unchanged   Labs on Admission: I have personally reviewed the available labs and imaging studies at the time of the admission.  Pertinent labs:   Glucose 123 BUN 96/Creatinine 3.94/GFR 13; 32/1.36/45 in 01/2019 BNP 241.6 HS troponin 37, 35 Lactate 1.1 WBC 14.3 Hgb 11.9 COVID/flu negative UA WNL   Assessment/Plan Principal Problem:   AKI (acute kidney injury) ([place]) Active Problems:   Coronary atherosclerosis   Mixed hyperlipidemia   AMS (altered mental status)   Essential hypertension, benign   DNR (do not resuscitate)   * AKI (acute kidney injury) ([place]) -Patient with baseline stage 3 CKD in 2020  -Current creatinine is significantly elevated (1.36 -> 3.94) and GFR is significantly reduced (45 -> 13) -Likely due to prerenal secondary to severe dehydration in the setting of intentionally reduced fluids to prevent nocturia and confusion, high environmental heat -IVF -Check FeNa and US-renal -Follow up renal function by BMP -Avoid ACEI and NSAIDs  AMS (altered mental status) -Confusion, likely resulting from dehydration, possibly uremia -Will continue to monitor, anticipate resolution with improving renal function -PT/OT consults requested -Nutrition consulted  Mixed hyperlipidemia -Continue Zocor  Coronary atherosclerosis -s/p CABG -Continue ASA -No c/o chest pain at this time, appears to be compensated  Essential hypertension, benign -Borderline low BP, hold Toprol XL, Norvasc and resume when needed -Hold Lisinopril and HCTZ due to AKI -Will add prn IV hydralazine  DNR (do not resuscitate) -I have discussed code status with the patient's son; the patient would not desire resuscitation and would prefer to die a natural death should that situation arise. -He will need a gold out of facility DNR form at  the time of discharge     Note: This patient has been tested and is negative for the novel coronavirus COVID-19.    Level of care: Med-Surg DVT prophylaxis: Heparin Code Status:  DNR - confirmed with family Family Communication: None present; I spoke with the patient's husband by telephone at the time of admission. Disposition Plan:  The patient is from: home  Anticipated d/c is to: home, possibly with [facility] services  Anticipated d/c date will depend on clinical response to treatment, likely 1-2 days  Patient is currently: acutely ill Consults called: PT/OT; nutrition  Admission status:  Admit - It is my clinical opinion that admission to INPATIENT is reasonable and necessary because of the expectation that this patient will require hospital care that crosses at least 2 midnights to treat this condition based on the medical complexity of the problems presented.  Given the aforementioned information, the predictability of an adverse outcome is felt to be significant.    Karmen Bongo MD [facility]   How to contact the [facility] Attending or Consulting provider [place] or covering provider during after hours [place], for this patient?  Check the care team in [facility] and look for a) attending/consulting [facility] provider listed and b) the [facility] team listed Log into [email or web address] and use [place]'s universal password to access. If you do not have the password, please contact the hospital operator. Locate the [facility] provider you are looking for under [facility] and page to a number that you can be directly reached. If you still have difficulty reaching the provider, please page the [facility] (Director on Call) for the Hospitalists listed on amion for assistance.   08/02/2021, 11:27 AM

## 2021-08-02 NOTE — ED Notes (Signed)
Pts son called and informed of pts room and condition.

## 2021-08-02 NOTE — ED Notes (Signed)
Pt given multiple sprites and water cup to assist with UA.

## 2021-08-02 NOTE — ED Provider Notes (Signed)
Nursing notes and vitals signs, including pulse oximetry, reviewed.  Summary of this visit's results, reviewed by myself:  EKG:  EKG Interpretation  Date/Time:  Saturday August 01 2021 22:19:26 EDT Ventricular Rate:  72 PR Interval:  218 QRS Duration: 182 QT Interval:  422 QTC Calculation: 462 R Axis:   -27 Text Interpretation: Sinus or ectopic atrial rhythm Borderline prolonged PR interval Right bundle branch block Confirmed by Lennice Sites ([id number]) on 08/01/2021 10:21:46 PM        Labs:  Results for orders placed or performed during the hospital encounter of 08/01/21 (from the past 24 hour(s))  POC CBG, ED     Status: Abnormal   Collection Time: 08/01/21 10:20 PM  Result Value Ref Range   Glucose-Capillary 109 (H) 70 - 99 mg/dL  CBC with Differential     Status: Abnormal   Collection Time: 08/01/21 10:36 PM  Result Value Ref Range   WBC 14.3 (H) 4.0 - 10.5 K/uL   RBC 4.01 (L) 4.22 - 5.81 MIL/uL   Hemoglobin 11.9 (L) 13.0 - 17.0 g/dL   HCT 36.4 (L) 39.0 - 52.0 %   MCV 90.8 80.0 - 100.0 fL   MCH 29.7 26.0 - 34.0 pg   MCHC 32.7 30.0 - 36.0 g/dL   RDW 14.3 11.5 - 15.5 %   Platelets 250 150 - 400 K/uL   nRBC 0.0 0.0 - 0.2 %   Neutrophils Relative % 60 %   Neutro Abs 8.6 (H) 1.7 - 7.7 K/uL   Lymphocytes Relative 28 %   Lymphs Abs 4.0 0.7 - 4.0 K/uL   Monocytes Relative 12 %   Monocytes Absolute 1.6 (H) 0.1 - 1.0 K/uL   Eosinophils Relative 0 %   Eosinophils Absolute 0.0 0.0 - 0.5 K/uL   Basophils Relative 0 %   Basophils Absolute 0.0 0.0 - 0.1 K/uL   Immature Granulocytes 0 %   Abs Immature Granulocytes 0.05 0.00 - 0.07 K/uL  Comprehensive metabolic panel     Status: Abnormal   Collection Time: 08/01/21 10:36 PM  Result Value Ref Range   Sodium 145 135 - 145 mmol/L   Potassium 4.2 3.5 - 5.1 mmol/L   Chloride 106 98 - 111 mmol/L   CO2 24 22 - 32 mmol/L   Glucose, Bld 123 (H) 70 - 99 mg/dL   BUN 96 (H) 8 - 23 mg/dL   Creatinine, Ser 3.94 (H) 0.61 - 1.24 mg/dL    Calcium 9.0 8.9 - 10.3 mg/dL   Total Protein 7.9 6.5 - 8.1 g/dL   Albumin 4.2 3.5 - 5.0 g/dL   AST 21 15 - 41 U/L   ALT 14 0 - 44 U/L   Alkaline Phosphatase 63 38 - 126 U/L   Total Bilirubin 0.6 0.3 - 1.2 mg/dL   GFR, Estimated 13 (L) >60 mL/min   Anion gap 15 5 - 15  Lipase, blood     Status: None   Collection Time: 08/01/21 10:36 PM  Result Value Ref Range   Lipase 27 11 - 51 U/L  Brain natriuretic peptide     Status: Abnormal   Collection Time: 08/01/21 10:36 PM  Result Value Ref Range   B Natriuretic Peptide 241.6 (H) 0.0 - 100.0 pg/mL  Troponin I (High Sensitivity)     Status: Abnormal   Collection Time: 08/01/21 10:36 PM  Result Value Ref Range   Troponin I (High Sensitivity) 37 (H) <18 ng/L  Lactic acid, plasma     Status: None  Collection Time: 08/01/21 10:37 PM  Result Value Ref Range   Lactic Acid, Venous 1.1 0.5 - 1.9 mmol/L  Resp Panel by RT-PCR (Flu A&B, Covid) Nasopharyngeal Swab     Status: None   Collection Time: 08/01/21 10:56 PM   Specimen: Nasopharyngeal Swab; Nasopharyngeal(NP) swabs in vial transport medium  Result Value Ref Range   SARS Coronavirus 2 by RT PCR NEGATIVE NEGATIVE   Influenza A by PCR NEGATIVE NEGATIVE   Influenza B by PCR NEGATIVE NEGATIVE  Urinalysis, Routine w reflex microscopic     Status: None   Collection Time: 08/02/21  1:00 AM  Result Value Ref Range   Color, Urine YELLOW YELLOW   APPearance CLEAR CLEAR   Specific Gravity, Urine 1.019 1.005 - 1.030   pH 5.0 5.0 - 8.0   Glucose, UA NEGATIVE NEGATIVE mg/dL   Hgb urine dipstick NEGATIVE NEGATIVE   Bilirubin Urine NEGATIVE NEGATIVE   Ketones, ur NEGATIVE NEGATIVE mg/dL   Protein, ur NEGATIVE NEGATIVE mg/dL   Nitrite NEGATIVE NEGATIVE   Leukocytes,Ua NEGATIVE NEGATIVE   RBC / HPF 0-5 0 - 5 RBC/hpf   WBC, UA 0-5 0 - 5 WBC/hpf   Squamous Epithelial / LPF 0-5 0 - 5   Mucus PRESENT    Hyaline Casts, UA PRESENT   Troponin I (High Sensitivity)     Status: Abnormal   Collection  Time: 08/02/21  1:15 AM  Result Value Ref Range   Troponin I (High Sensitivity) 35 (H) <18 ng/L    Imaging Studies: CT Head Wo Contrast  Result Date: 08/01/2021 CLINICAL DATA:  Altered mental status. EXAM: CT HEAD WITHOUT CONTRAST TECHNIQUE: Contiguous axial images were obtained from the base of the skull through the vertex without intravenous contrast. COMPARISON:  None. BRAIN: BRAIN Cerebral ventricle sizes are concordant with the degree of cerebral volume loss. Patchy and confluent areas of decreased attenuation are noted throughout the deep and periventricular white matter of the cerebral hemispheres bilaterally, compatible with chronic microvascular ischemic disease. No evidence of large-territorial acute infarction. No parenchymal hemorrhage. No mass lesion. No extra-axial collection. No mass effect or midline shift. No hydrocephalus. Basilar cisterns are patent. Vascular: No hyperdense vessel. Atherosclerotic calcifications are present within the cavernous internal carotid and vertebral arteries. Skull: No acute fracture or focal lesion. Sinuses/Orbits: Paranasal sinuses and mastoid air cells are clear. Left lens replacement. Otherwise the orbits are unremarkable. Other: None. IMPRESSION: No acute intracranial abnormality. Electronically Signed   By: Iven Finn M.D.   On: 08/01/2021 23:15   DG Chest Portable 1 View  Result Date: 08/01/2021 CLINICAL DATA:  Cough. Altered mental status. Chills. Possible infection. EXAM: PORTABLE CHEST 1 VIEW COMPARISON:  11/20/2007 FINDINGS: Postoperative changes in the mediastinum. Heart size and pulmonary vascularity are normal. Linear atelectasis or fibrosis in the lung bases is unchanged since prior study. No airspace disease or consolidation. No pleural effusions. No pneumothorax. Mediastinal contours appear intact. Calcification of the aorta. Degenerative changes in the spine and shoulders. IMPRESSION: No evidence of active pulmonary disease. Linear  fibrosis or atelectasis in the lung bases. Electronically Signed   By: Lucienne Capers M.D.   On: 08/01/2021 23:19    1:04 AM The most recent BUN and creatinine for comparison were from 01/31/2019 at [facility].  BUN was 32 and creatinine was 1.36.  1:44 AM No evidence of urinary tract infection or pneumonia.  COVID and flu tests are negative.  I suspect his altered mental status is related to his acute renal failure.  Elevated troponins may be due to renal insufficiency.  2:13 AM Dr. Tonie Griffith to admit to hospitalist service.    Shanon Rosser, MD 08/02/21 [phone]

## 2021-08-02 NOTE — ED Notes (Signed)
In and out performed with tech and this RN to obtained UA. 500 mL output.

## 2021-08-02 NOTE — Assessment & Plan Note (Signed)
Continue Zocor 

## 2021-08-02 NOTE — Assessment & Plan Note (Addendum)
Blood pressure improving.  Continue Toprol-XL.  Hold other medication.  Continue Flomax.

## 2021-08-02 NOTE — Assessment & Plan Note (Addendum)
Admitting provider discussed goals of care with the patient.  Currently DNR.

## 2021-08-02 NOTE — Assessment & Plan Note (Addendum)
Presented with confusion mostly combination of dehydration and uremia. Improving with IV hydration. Per daughter close to baseline but not back to baseline. No focal deficit at the time of my evaluation.

## 2021-08-02 NOTE — ED Notes (Signed)
Attempted report to 4np, no answer.

## 2021-08-02 NOTE — Assessment & Plan Note (Addendum)
s/p CABG Continue ASA

## 2021-08-02 NOTE — Progress Notes (Signed)
Pt arrived to unit. [facility] admit paged of pts arrival.

## 2021-08-03 DIAGNOSIS — N179 Acute kidney failure, unspecified: Secondary | ICD-10-CM | POA: Diagnosis not present

## 2021-08-03 LAB — URINE CULTURE: Culture: NO GROWTH

## 2021-08-03 LAB — BLOOD CULTURE ID PANEL (REFLEXED) - BCID2

## 2021-08-03 LAB — CBC
HCT: 34.9 % — ABNORMAL LOW (ref 39.0–52.0)
Hemoglobin: 11.6 g/dL — ABNORMAL LOW (ref 13.0–17.0)
MCH: 30 pg (ref 26.0–34.0)
MCHC: 33.2 g/dL (ref 30.0–36.0)
MCV: 90.2 fL (ref 80.0–100.0)
Platelets: 202 10*3/uL (ref 150–400)
RBC: 3.87 MIL/uL — ABNORMAL LOW (ref 4.22–5.81)
RDW: 13.7 % (ref 11.5–15.5)
WBC: 10.3 10*3/uL (ref 4.0–10.5)
nRBC: 0 % (ref 0.0–0.2)

## 2021-08-03 LAB — BASIC METABOLIC PANEL
Anion gap: 9 (ref 5–15)
BUN: 85 mg/dL — ABNORMAL HIGH (ref 8–23)
CO2: 24 mmol/L (ref 22–32)
Calcium: 8.4 mg/dL — ABNORMAL LOW (ref 8.9–10.3)
Chloride: 102 mmol/L (ref 98–111)
Creatinine, Ser: 2.08 mg/dL — ABNORMAL HIGH (ref 0.61–1.24)
GFR, Estimated: 29 mL/min — ABNORMAL LOW (ref >60)
Glucose, Bld: 100 mg/dL — ABNORMAL HIGH (ref 70–99)
Potassium: 4.5 mmol/L (ref 3.5–5.1)
Sodium: 135 mmol/L (ref 135–145)

## 2021-08-03 MED ORDER — ENSURE ENLIVE PO LIQD
237.0000 mL | Freq: Two times a day (BID) | ORAL | Status: DC
  Administered 2021-08-03: 237 mL via ORAL

## 2021-08-03 MED ORDER — ADULT MULTIVITAMIN W/MINERALS CH
1.0000 | ORAL_TABLET | Freq: Every day | ORAL | Status: DC
  Administered 2021-08-03 – 2021-08-05 (×3): 1 via ORAL
  Filled 2021-08-03 (×3): qty 1

## 2021-08-03 NOTE — Progress Notes (Signed)
  Progress Note    Terry [name]   JSH:[id number]  DOB: 29-May-1924  DOA: 08/01/2021     1 Date of Service: 08/03/2021   Subjective:  No nausea no vomiting no fever no chills.  No chest pain abdominal pain.  Tolerating oral diet.  Hospital Problems * AKI (acute kidney injury) [facility]) Patient with baseline stage 3 CKD in 2020 Serum creatinine baseline 1.36.  On presentation serum creatinine 3.94.  BUN was also elevated.  Likely uremia causing acute encephalopathy. Received IV hydration.  With improvement in serum creatinine. Continue to provide IV hydration. US renal negative for any acute obstruction or stone. Likely prerenal etiology in the setting of diarrhea that the patient had a few days ago as well as ongoing use of diuretics.  Acute metabolic encephalopathy Presented with confusion mostly combination of dehydration and uremia. Improving with IV hydration. Per daughter close to baseline but not back to baseline. No focal deficit at the time of my evaluation.  Essential hypertension, benign Blood pressure improving.  Continue Toprol-XL.  Hold other medication.  Continue Flomax.  Coronary atherosclerosis s/p CABG Continue ASA  Mixed hyperlipidemia -Continue Zocor  DNR (do not resuscitate) Admitting provider discussed goals of care with the patient.  Currently DNR.     Objective Vital signs were reviewed and unremarkable.  Vitals:   08/03/21 0000 08/03/21 0414 08/03/21 0737 08/03/21 1100  BP:  (!) 102/51 (!) 110/45 (!) 110/55  Pulse:  70 72 84  Resp: (!) 21 20 20 20   Temp:  97.9 F (36.6 C) 98 F (36.7 C) 98.4 F (36.9 C)  TempSrc:  Axillary  Oral  SpO2:  91% 98% 97%  Weight:      Height:       86.7 kg  Exam General: Appear in mild distress, no Rash; Oral Mucosa Clear, moist. no Abnormal Neck Mass Or lumps, Conjunctiva normal  Cardiovascular: S1 and S2 Present, no Murmur, Respiratory: good respiratory effort, Bilateral Air entry present and CTA, no  Crackles, no wheezes Abdomen: Bowel Sound present, Soft and no tenderness Extremities: no Pedal edema Neurology: alert and oriented to time, place, and person affect appropriate. no new focal deficit Gait not checked due to patient safety concerns   Labs / Other Information Improved serum creatinine.  Electrolytes stable.    Time spent: 35 mins  [facility] 08/03/2021, 8:25 PM

## 2021-08-03 NOTE — Progress Notes (Addendum)
Initial Nutrition Assessment  DOCUMENTATION CODES:   Not applicable  INTERVENTION:   Multivitamin w/ minerals daily  Ensure Enlive (vanilla) po BID, each supplement provides 350 kcal and 20 grams of protein  Encourage good PO intake   NUTRITION DIAGNOSIS:   Increased nutrient needs related to acute illness as evidenced by estimated needs.  GOAL:   Patient will meet greater than or equal to 90% of their needs  MONITOR:   PO intake, Supplement acceptance, Labs  REASON FOR ASSESSMENT:   Consult Other (Comment) (Nutrition Goals)  ASSESSMENT:   87 y.o. male presented to the ED with confusion. PMH of HTN and CAD. Pt admitted with AKI.   PT daughter at bedside at time of visit. Pt sitting in chair, wrapped in blanket, looking out window, and eating lunch at time of visit.   Pt reports that his appetite is back and he is  grateful for that. Stated that the food here is good. Per daughter, pt's typical intake includes Breakfast: oatmeal and coffee; Lunch and Dinner: meat and vegetable. Pt lives alone but has a caregiver in for 1/2 a day that provides lunch and dinner for pt or family bring pt meals.  Per EMR, pt ate 100% of his lunch on 10/9.  Per pt his UBW is 200-210# and has not experienced any recent weight loss. Per EMR, pt has had 12.5% weight loss in ~1 year, which is not clinically significant for time frame. Pt reports using a cane to ambulate.   Medications reviewed and include: Colace Labs reviewed: BUN 85, Creatinine 2.08  NUTRITION - FOCUSED PHYSICAL EXAM:  Flowsheet Row Most Recent Value  Orbital Region Mild depletion  Upper Arm Region No depletion  Thoracic and Lumbar Region No depletion  Buccal Region Mild depletion  Temple Region Mild depletion  Clavicle Bone Region No depletion  Clavicle and Acromion Bone Region No depletion  Scapular Bone Region No depletion  Dorsal Hand Mild depletion  Patellar Region Unable to assess  Anterior Thigh Region Unable  to assess  Posterior Calf Region Unable to assess  Edema (RD Assessment) None  Hair Reviewed  Eyes Reviewed  Mouth Reviewed  Skin Reviewed  Nails Reviewed       Diet Order:   Diet Order             Diet regular Room service appropriate? Yes; Fluid consistency: Thin  Diet effective now                   EDUCATION NEEDS:   No education needs have been identified at this time  Skin:  Skin Assessment: Skin Integrity Issues: Skin Integrity Issues:: DTI, Other (Comment) DTI: R Heel Other: Non-Pressure wound - L Axilla  Last BM:  08/02/21 - Type 4  Height:   Ht Readings from Last 1 Encounters:  08/02/21 6\' 1"  (1.854 m)    Weight:   Wt Readings from Last 1 Encounters:  08/02/21 86.7 kg    Ideal Body Weight:  83.6 kg  BMI:  Body mass index is 25.22 kg/m.  Estimated Nutritional Needs:   Kcal:  1800-2000  Protein:  90-105 grams  Fluid:  >/= 1.8 L    Tamantha Saline BS, PLDN Clinical Dietitian See AMiON for contact information.

## 2021-08-03 NOTE — Progress Notes (Signed)
PHARMACY - PHYSICIAN COMMUNICATION CRITICAL VALUE ALERT - BLOOD CULTURE IDENTIFICATION (BCID)  Terry [name] is an 87 y.o. male who presented to [facility] on 08/01/2021 with a chief complaint of confusion.  Assessment:   1/4 bottles with GPC BCID detected Staphylococcus species  Name of physician (or Provider) Contacted: Dr. Posey Pronto  Current antibiotics: None  Changes to prescribed antibiotics recommended:  Coagulase negative staphylococcus species in 1/4 bottles likely represents a contaminant. No antibiotics indicated at this time.  Results for orders placed or performed during the hospital encounter of 08/01/21  Blood Culture ID Panel (Reflexed) (Collected: 08/01/2021 10:56 PM)  Result Value Ref Range   Enterococcus faecalis NOT DETECTED NOT DETECTED   Enterococcus Faecium NOT DETECTED NOT DETECTED   Listeria monocytogenes NOT DETECTED NOT DETECTED   Staphylococcus species DETECTED (A) NOT DETECTED   Staphylococcus aureus (BCID) NOT DETECTED NOT DETECTED   Staphylococcus epidermidis NOT DETECTED NOT DETECTED   Staphylococcus lugdunensis NOT DETECTED NOT DETECTED   Streptococcus species NOT DETECTED NOT DETECTED   Streptococcus agalactiae NOT DETECTED NOT DETECTED   Streptococcus pneumoniae NOT DETECTED NOT DETECTED   Streptococcus pyogenes NOT DETECTED NOT DETECTED   A.calcoaceticus-baumannii NOT DETECTED NOT DETECTED   Bacteroides fragilis NOT DETECTED NOT DETECTED   Enterobacterales NOT DETECTED NOT DETECTED   Enterobacter cloacae complex NOT DETECTED NOT DETECTED   Escherichia coli NOT DETECTED NOT DETECTED   Klebsiella aerogenes NOT DETECTED NOT DETECTED   Klebsiella oxytoca NOT DETECTED NOT DETECTED   Klebsiella pneumoniae NOT DETECTED NOT DETECTED   Proteus species NOT DETECTED NOT DETECTED   Salmonella species NOT DETECTED NOT DETECTED   Serratia marcescens NOT DETECTED NOT DETECTED   Haemophilus influenzae NOT DETECTED NOT DETECTED   Neisseria meningitidis NOT  DETECTED NOT DETECTED   Pseudomonas aeruginosa NOT DETECTED NOT DETECTED   Stenotrophomonas maltophilia NOT DETECTED NOT DETECTED   Candida albicans NOT DETECTED NOT DETECTED   Candida auris NOT DETECTED NOT DETECTED   Candida glabrata NOT DETECTED NOT DETECTED   Candida krusei NOT DETECTED NOT DETECTED   Candida parapsilosis NOT DETECTED NOT DETECTED   Candida tropicalis NOT DETECTED NOT DETECTED   Cryptococcus neoformans/gattii NOT DETECTED NOT DETECTED   Lestine Box, PharmD PGY2 Infectious Diseases Pharmacy Resident   Please check [email or web address] for unit-specific pharmacy phone numbers

## 2021-08-03 NOTE — Evaluation (Signed)
Physical Therapy Evaluation Patient Details Name: Terry [name] MRN: [id number] DOB: 12-02-1923 Today's Date: 08/03/2021  History of Present Illness  87 y.o. male admitted on 08/01/21 for confusion.  Pt dx with AKI, AMS.  Pt with significant PMH of essential HTN, HTN, mitral valve regugitation, CABG, lumbar laminectomy, R knee arthroscopy.  Clinical Impression  Pt was able to walk a short distance down the hallway with one person assist and RW.  He uses a heavy cane at baseline and furniture walks with his free hand.  Daughter, [name], reports that he has been encourage to use a RW but currently does not. He has two of them.  Pt participated in seated and standing exercises at the end of the session.  PT will continue to follow acutely for safe mobility progression.     Recommendations for follow up therapy are one component of a multi-disciplinary discharge planning process, led by the attending physician.  Recommendations may be updated based on patient status, additional functional criteria and insurance authorization.  Follow Up Recommendations Home health PT    Equipment Recommendations  None recommended by PT    Recommendations for Other Services       Precautions / Restrictions Precautions Precautions: Fall Precaution Comments: history of L foot drop (has had it for years since back surgery) does not wear a brace      Mobility  Bed Mobility               General bed mobility comments: Pt was OOB in the recliner chair visiting with his daughter, [name].    Transfers Overall transfer level: Needs assistance Equipment used: Rolling walker (2 wheeled) Transfers: Sit to/from Stand Sit to Stand: Min guard         General transfer comment: Min guard assist for safety, practiced sit to stands multiple time with emphasis on controlling descent to sit (he tends to crash).  Ambulation/Gait Ambulation/Gait assistance: Min guard Gait Distance (Feet): 50 Feet (x2 seated  rest break half way) Assistive device: Rolling walker (2 wheeled) (uses cane and walks with furniture with free hand at baseline per daughter he will not use RW) Gait Pattern/deviations: Step-through pattern;Decreased dorsiflexion - left;Steppage        Stairs            Wheelchair Mobility    Modified Rankin (Stroke Patients Only)       Balance Overall balance assessment: Needs assistance Sitting-balance support: No upper extremity supported;Feet supported Sitting balance-Leahy Scale: Good     Standing balance support: Bilateral upper extremity supported;No upper extremity supported;Single extremity supported Standing balance-Leahy Scale: Poor Standing balance comment: needs external support in standing from RW and light support from therapist.                             Pertinent Vitals/Pain Pain Assessment: No/denies pain    Home Living Family/patient expects to be discharged to:: Private residence Living Arrangements: Alone Available Help at Discharge: Family;Personal care attendant;Available PRN/intermittently Type of Home: House       Home Layout: Laundry or work area in [place]: Environmental consultant - 2 wheels;Walker - 4 wheels;Cane - single point      Prior Function Level of Independence: Independent with assistive device(s)         Comments: walks with a cane, has a houskeeper who comes 3 times a week and cooks and cleans for him, neighbors do Haematologist, he likes to do  cross word puzzles, retired Horticulturist, commercial, has a life alert, but forgets to use it, daughter calls to check on him daily, stops by once a week.     Hand Dominance   Dominant Hand: Right    Extremity/Trunk Assessment   Upper Extremity Assessment Upper Extremity Assessment: Defer to OT evaluation    Lower Extremity Assessment Lower Extremity Assessment: Generalized weakness (left ankle DF is 2/5)    Cervical / Trunk Assessment Cervical / Trunk Assessment: Other  exceptions Cervical / Trunk Exceptions: h/o lumbar kyphoplasty (in his 55s)  Communication   Communication: HOH  Cognition Arousal/Alertness: Awake/alert Behavior During Therapy: WFL for tasks assessed/performed Overall Cognitive Status: Impaired/Different from baseline Area of Impairment: Memory;Following commands;Safety/judgement;Awareness;Problem solving                     Memory: Decreased short-term memory Following Commands: Follows one step commands with increased time Safety/Judgement: Decreased awareness of safety;Decreased awareness of deficits Awareness: Emergent Problem Solving: Difficulty sequencing;Requires verbal cues;Requires tactile cues General Comments: Pt with difficulty sequencing exercises despite multimodal cues, hearing does not help comprehension.      General Comments      Exercises General Exercises - Upper Extremity Shoulder Flexion: AROM;Both;10 reps Elbow Flexion: AROM;Both;10 reps Elbow Extension: AROM;Both;10 reps General Exercises - Lower Extremity Long Arc Quad: AROM;Both;10 reps Hip Flexion/Marching: AROM;Both;10 reps Toe Raises: AROM;Both;10 reps Heel Raises: AROM;Both;10 reps Mini-Sqauts: AROM;Both;10 reps   Assessment/Plan    PT Assessment Patient needs continued PT services  PT Problem List Decreased strength;Decreased balance;Decreased activity tolerance;Decreased mobility;Decreased cognition;Decreased knowledge of use of DME;Decreased safety awareness;Decreased knowledge of precautions       PT Treatment Interventions DME instruction;Gait training;Stair training;Functional mobility training;Therapeutic activities;Therapeutic exercise;Balance training;Neuromuscular re-education;Cognitive remediation;Patient/family education    PT Goals (Current goals can be found in the Care Plan section)  Acute Rehab PT Goals Patient Stated Goal: to go home and stay independent as long as possible. PT Goal Formulation: With  patient/family Time For Goal Achievement: 08/17/21 Potential to Achieve Goals: Good    Frequency Min 3X/week   Barriers to discharge        Co-evaluation               AM-PAC PT "6 Clicks" Mobility  Outcome Measure Help needed turning from your back to your side while in a flat bed without using bedrails?: A Little Help needed moving from lying on your back to sitting on the side of a flat bed without using bedrails?: A Little Help needed moving to and from a bed to a chair (including a wheelchair)?: A Little Help needed standing up from a chair using your arms (e.g., wheelchair or bedside chair)?: A Little Help needed to walk in hospital room?: A Little Help needed climbing 3-5 steps with a railing? : A Little 6 Click Score: 18    End of Session   Activity Tolerance: Patient limited by fatigue Patient left: in chair;with call bell/phone within reach;with family/visitor present   PT Visit Diagnosis: Muscle weakness (generalized) (M62.81);Difficulty in walking, not elsewhere classified (R26.2)    Time: 5364-6803 PT Time Calculation (min) (ACUTE ONLY): 34 min   Charges:   PT Evaluation $PT Eval Moderate Complexity: 1 Mod PT Treatments $Gait Training: 8-22 mins        Verdene Lennert, PT, DPT  Acute Rehabilitation Ortho Tech Supervisor [phone] pager [phone]) [phone] office

## 2021-08-04 ENCOUNTER — Inpatient Hospital Stay (HOSPITAL_COMMUNITY): Payer: Medicare HMO

## 2021-08-04 DIAGNOSIS — R5381 Other malaise: Secondary | ICD-10-CM | POA: Diagnosis present

## 2021-08-04 DIAGNOSIS — N179 Acute kidney failure, unspecified: Secondary | ICD-10-CM | POA: Diagnosis not present

## 2021-08-04 LAB — RENAL FUNCTION PANEL
Albumin: 2.9 g/dL — ABNORMAL LOW (ref 3.5–5.0)
Anion gap: 5 (ref 5–15)
BUN: 59 mg/dL — ABNORMAL HIGH (ref 8–23)
CO2: 28 mmol/L (ref 22–32)
Calcium: 8.6 mg/dL — ABNORMAL LOW (ref 8.9–10.3)
Chloride: 104 mmol/L (ref 98–111)
Creatinine, Ser: 1.48 mg/dL — ABNORMAL HIGH (ref 0.61–1.24)
GFR, Estimated: 43 mL/min — ABNORMAL LOW (ref >60)
Glucose, Bld: 124 mg/dL — ABNORMAL HIGH (ref 70–99)
Phosphorus: 2.8 mg/dL (ref 2.5–4.6)
Potassium: 4.7 mmol/L (ref 3.5–5.1)
Sodium: 137 mmol/L (ref 135–145)

## 2021-08-04 MED ORDER — SODIUM CHLORIDE 0.9 % IV SOLN: INTRAVENOUS | Status: DC

## 2021-08-04 NOTE — Progress Notes (Signed)
  Progress Note    Terry [name]   ITG:[id number]  DOB: [birth year]  DOA: 08/01/2021     2 Date of Service: 08/04/2021   Subjective:  No nausea no vomiting.  No fever no chills.  Per PT patient is more fatigued and tired.  Has no cough.  Was more winded on working.  Hospital Problems * AKI (acute kidney injury) [facility]) Patient with baseline stage 3 CKD in 2020 Serum creatinine baseline 1.36.  On presentation serum creatinine 3.94.  BUN was also elevated.  Likely uremia causing acute encephalopathy. Received IV hydration.  With improvement in serum creatinine. Given the worsening respiratory status.  I will be holding IV fluids.  Renal function still not back to baseline. US renal negative for any acute obstruction or stone. Likely prerenal etiology in the setting of diarrhea that the patient had a few days ago as well as ongoing use of diuretics.  Acute metabolic encephalopathy Presented with confusion mostly combination of dehydration and uremia. Improving with IV hydration. Per daughter close to baseline but not back to baseline. No focal deficit at the time of my evaluation.  Essential hypertension, benign Blood pressure improving.  Continue Toprol-XL.  Hold other medication.  Continue Flomax.  Coronary atherosclerosis s/p CABG Continue ASA  Physical deconditioning PT OT recommends home health.  Mixed hyperlipidemia -Continue Zocor  DNR (do not resuscitate) Admitting provider discussed goals of care with the patient.  Currently DNR.     Objective Vital signs were reviewed and unremarkable.  Vitals:   08/04/21 0748 08/04/21 1100 08/04/21 1553 08/04/21 1944  BP: 114/64 (!) 122/55 126/77 (!) 146/50  Pulse: 81 88 84 79  Resp: 20 20 20 20   Temp: 98.1 F (36.7 C) 98.4 F (36.9 C) 98 F (36.7 C) 98.4 F (36.9 C)  TempSrc:   Oral Oral  SpO2: 100% 100% 100% 95%  Weight:      Height:       86.7 kg  Exam General: Appear in mild distress, no Rash; Oral Mucosa Clear,  moist. no Abnormal Neck Mass Or lumps, Conjunctiva normal  Cardiovascular: S1 and S2 Present, no Murmur, Respiratory: increased respiratory effort, Bilateral Air entry present and bilateral  Crackles, no wheezes Abdomen: Bowel Sound present, Soft and no tenderness Extremities: trace Pedal edema Neurology: alert and oriented to time, place, and person affect appropriate. no new focal deficit Gait not checked due to patient safety concerns     Labs / Other Information Renal function improving.  Chest x-ray shows no acute abnormality.    Time spent: 35 mins [facility] 08/04/2021, 8:13 PM

## 2021-08-04 NOTE — Progress Notes (Signed)
PT Cancellation Note  Patient Details Name: HARTMAN MINAHAN MRN: [id number] DOB: 10-03-1924   Cancelled Treatment:    Reason Eval/Treat Not Completed: Other (comment).  OT working with pt.  PT to give him a rest before seeing him for gait.  Will check back later today as time allows.   Thanks,  Verdene Lennert, PT, DPT  Acute Rehabilitation Ortho Tech Supervisor [phone] pager #[phone] office      Wells Guiles B Rosalita Carey 08/04/2021, 1:04 PM

## 2021-08-04 NOTE — TOC Initial Note (Signed)
Transition of Care (TOC) - Initial/Assessment Note  Marvetta Gibbons RN,BSN Transitions of Care Unit 4NP (Non Trauma)- RN Case Manager See Treatment Team for direct Phone #    Patient Details  Name: Terry [name] MRN: [id number] Date of Birth: Jun 29, 1924  Transition of Care [facility]) CM/SW Contact:    Dawayne Patricia, RN Phone Number: 08/04/2021, 4:19 PM  Clinical Narrative:                 Received request from bedside RN that daughter [name] would like to speak with CM. Call made to [name] at [phone] to discuss concerns. Per [name] her dad had a referral from his PCP office for [facility] services however she can not remember name of agency- choice offered for [facility] needs- she states she does not really have a preference as long as it's in -network with his insurance. She is ok staying with agency that PCP used.  Talked about DME- pt has all needed DME including walker, cane, shower stool, elevated toilet seat, wheel chair.   Daughter also asked about aide assistance at home, info provided for this which is out of pocket cost- no insurance coverage- daughter agreeable to be provided with [facility]- and for them to make contact with her for further info on services and see if they have aides available for service area.  Call made to Tim with [facility] to reach out to daughter.   Call made to PCP office to find out what agency [facility] referral was made to - per PCP office- referral was made to [facility] for [place] needs.  Call made to [facility] to confirm [facility] referral from PCP and services-   Expected Discharge Plan: [place] Barriers to Discharge: Continued Medical Work up   Patient Goals and CMS Choice Patient states their goals for this hospitalization and ongoing recovery are:: return home CMS Medicare.gov Compare Post Acute Care list provided to:: Patient Choice offered to / list presented to : Adult Children  Expected Discharge Plan and Services Expected Discharge  Plan: [place]   Discharge Planning Services: CM Consult Post Acute Care Choice: Home Health, Resumption of Svcs/PTA Provider Living arrangements for the past 2 months: Single Family Home                           HH Arranged: PT, OT [place] Agency: [place] ([facility]) Date [place]: 08/04/21 Time HH Agency Contacted: 1430 Representative spoke with at [place]: [place] Arrangements/Services Living arrangements for the past 2 months: [place] with:: Self, Adult Children Patient language and need for interpreter reviewed:: Yes Do you feel safe going back to the place where you live?: Yes      Need for Family Participation in Patient Care: Yes (Comment) Care giver support system in place?: Yes (comment) Current home services: DME (walker, shower chair, elevated toilet, w/c) Criminal Activity/Legal Involvement Pertinent to Current Situation/Hospitalization: No - Comment as needed  Activities of Daily Living   ADL Screening (condition at time of admission) Patient's cognitive ability adequate to safely complete daily activities?: Yes Is the patient deaf or have difficulty hearing?: Yes Does the patient have difficulty seeing, even when wearing glasses/contacts?: No Does the patient have difficulty concentrating, remembering, or making decisions?: No Patient able to express need for assistance with ADLs?: Yes Does the patient have difficulty dressing or bathing?: No Independently  performs ADLs?: Yes (appropriate for developmental age) Does the patient have difficulty walking or climbing stairs?: Yes Weakness of Legs: Both Weakness of Arms/Hands: None  Permission Sought/Granted Permission sought to share information with : Facility Art therapist granted to share information with : Yes, Verbal Permission Granted  Share Information with NAME: [name]  Permission granted to share info w  AGENCY: HH/PCP  Permission granted to share info w Relationship: Daughter  Permission granted to share info w Contact Information: [phone]  Emotional Assessment   Attitude/Demeanor/Rapport: Unable to Assess Affect (typically observed): Unable to Assess   Alcohol / Substance Use: Not Applicable Psych Involvement: No (comment)  Admission diagnosis:  AKI (acute kidney injury) ([place]) [N17.9] Acute renal failure, unspecified acute renal failure type [facility]) [N17.9] Patient Active Problem List   Diagnosis Date Noted   AKI (acute kidney injury) ([place]) 08/02/2021   DNR (do not resuscitate) 54/98/2641   Acute metabolic encephalopathy 58/30/9407   RBBB 06/10/2017   Edema 05/22/2014   Coronary atherosclerosis 12/28/2013   Essential hypertension, benign 12/28/2013   Mixed hyperlipidemia 12/28/2013   PCP:  Alroy Dust, L.Marlou Sa, MD Pharmacy:   [place], [address] [address] Phone: [phone] Fax: [phone]  [place], [place] - [address] [address] Phone: [phone] Fax: [place], [place] - [address] [place] [address] Phone: [phone] Fax: [phone]     Social Determinants of Health (SDOH) Interventions    Readmission Risk Interventions No flowsheet data found.

## 2021-08-04 NOTE — Progress Notes (Signed)
OT NOTE  OT evaluation complete and formal evaluation to follow. Recommendation for hHOT with family (A) / hired caregivers. Pt currently with wet breath sounds and provided a spirometer with education. Pt noted to have a constant nose drainage ane daughter reports that has been happening for a while without any resolve.   Fleeta Emmer, OTR/L  Acute Rehabilitation Services Pager: [phone] Office: [phone] .

## 2021-08-04 NOTE — Assessment & Plan Note (Signed)
PT OT recommends home health.

## 2021-08-04 NOTE — Evaluation (Signed)
Occupational Therapy Evaluation Patient Details Name: Terry [name] MRN: [id number] DOB: 08/14/1924 Today's Date: 08/04/2021   History of Present Illness 87 y.o. male admitted on 08/01/21 for confusion.  Pt dx with AKI, AMS.  Pt with significant PMH of essential HTN, HTN, mitral valve regugitation, CABG, lumbar laminectomy, R knee arthroscopy.   Clinical Impression   PT admitted with Confusion. Pt currently with functional limitiations due to the deficits listed below (see OT problem list). Pt currently requires min (A) with rw with all transfers for adls. Pt will need (A) for LB dressing and high fall risk for standing adls.  Pt will benefit from skilled OT to increase their independence and safety with adls and balance to allow discharge [place]. Family wishes to d/c home with increased in home care      Recommendations for follow up therapy are one component of a multi-disciplinary discharge planning process, led by the attending physician.  Recommendations may be updated based on patient status, additional functional criteria and insurance authorization.   Follow Up Recommendations  Home health OT    Equipment Recommendations  3 in 1 bedside commode;Other (comment) (rw)    Recommendations for Other Services       Precautions / Restrictions Precautions Precautions: Fall Precaution Comments: history of L foot drop (has had it for years since back surgery) does not wear a brace      Mobility Bed Mobility               General bed mobility comments: oob on arrival in chair    Transfers Overall transfer level: Needs assistance Equipment used: Rolling walker (2 wheeled) Transfers: Sit to/from Stand Sit to Stand: Min assist         General transfer comment: needs (A) to power up from low chair seat height.    Balance Overall balance assessment: Needs assistance Sitting-balance support: Bilateral upper extremity supported;Feet supported Sitting balance-Leahy Scale:  Good Sitting balance - Comments: can sit figure 4 and donn shoes   Standing balance support: Bilateral upper extremity supported Standing balance-Leahy Scale: Poor Standing balance comment: needs external support in standing from therapist and RW.                           ADL either performed or assessed with clinical judgement   ADL Overall ADL's : Needs assistance/impaired Eating/Feeding: Sitting;Minimal assistance   Grooming: Wash/dry hands;Moderate assistance;Standing Grooming Details (indicate cue type and reason): requires stedy support for static standing for hygiene Upper Body Bathing: Moderate assistance   Lower Body Bathing: Moderate assistance   Upper Body Dressing : Moderate assistance   Lower Body Dressing: Moderate assistance   Toilet Transfer: Minimal assistance;Grab Corporate investment banker- Clothing Manipulation and Hygiene: Moderate assistance       Functional mobility during ADLs: Minimal assistance;Rolling walker       Vision Baseline Vision/History: 1 Wears glasses       Perception     Praxis      Pertinent Vitals/Pain Pain Assessment: 0-10 Pain Score: 8  Pain Location: heel Pain Descriptors / Indicators: Guarding Pain Intervention(s): Monitored during session;Repositioned (educated on rolling foot on round can in room. utilize golf ball at home while sittinng)     Hand Dominance Right   Extremity/Trunk Assessment Upper Extremity Assessment Upper Extremity Assessment: Generalized weakness   Lower Extremity Assessment Lower Extremity Assessment: Defer to PT evaluation   Cervical / Trunk Assessment Cervical / Trunk Assessment:  Other exceptions Cervical / Trunk Exceptions: h/o lumbar kyphoplasty (in his 5s)   Communication Communication Communication: HOH   Cognition Arousal/Alertness: Awake/alert Behavior During Therapy: Flat affect Overall Cognitive Status: Impaired/Different from baseline Area of Impairment:  Problem solving                     Memory: Decreased short-term memory Following Commands: Follows one step commands with increased time Safety/Judgement: Decreased awareness of safety;Decreased awareness of deficits Awareness: Emergent Problem Solving: Slow processing General Comments: Pt appears fatigued, exhausted, slower to process today, less alert compared to yesterday, but still responsive to questions.  Does not remember where we walked yesterday, but did recall accurately what he ate for lunch.   General Comments  VSS- noticeable drainage from L side of nose, pt noticeable wet breath sounds. educated on spirometer    Exercises Exercises: General Upper Extremity;General Lower Extremity General Exercises - Upper Extremity Shoulder Flexion: AROM;Both;10 reps;Seated Elbow Flexion: AROM;Both;10 reps;Seated General Exercises - Lower Extremity Ankle Circles/Pumps: AROM;Both;10 reps;Seated Long Arc Quad: AROM;Both;10 reps;Seated Hip Flexion/Marching: AROM;Both;10 reps;Seated Toe Raises: AROM;Both;10 reps;Seated Heel Raises: AROM;Both;10 reps;Seated Mini-Sqauts: AROM;Both;10 reps;Standing   Shoulder Instructions      Home Living Family/patient expects to be discharged to:: Private residence Living Arrangements: Alone Available Help at Discharge: Family;Personal care attendant;Available PRN/intermittently Type of Home: House Home Access: Stairs to enter     Home Layout: Laundry or work area in basement     [employer] Shower/Tub: Occupational psychologist: Standard Bathroom Accessibility: Yes   Home Equipment: Environmental consultant - 2 wheels;Walker - 4 wheels;Cane - single point   Additional Comments: has hired care givers 3 days week      Prior Functioning/Environment Level of Independence: Independent with assistive device(s)        Comments: walks with a cane, has a houskeeper who comes 3 times a week and cooks and cleans for him, neighbors do Haematologist, he likes  to do cross word puzzles, retired Horticulturist, commercial, has a life alert, but forgets to use it, daughter calls to check on him daily, stops by once a week.        OT Problem List: Decreased strength;Decreased activity tolerance;Impaired balance (sitting and/or standing);Decreased cognition;Decreased safety awareness      OT Treatment/Interventions: Self-care/ADL training;Therapeutic exercise;Energy conservation;DME and/or AE instruction;Therapeutic activities;Cognitive remediation/compensation;Balance training;Patient/family education    OT Goals(Current goals can be found in the care plan section) Acute Rehab OT Goals Patient Stated Goal: to return home OT Goal Formulation: With patient/family Time For Goal Achievement: 08/18/21 Potential to Achieve Goals: Good  OT Frequency: Min 2X/week   Barriers to D/C:            Co-evaluation              AM-PAC OT "6 Clicks" Daily Activity     Outcome Measure Help from another person eating meals?: A Little Help from another person taking care of personal grooming?: A Little Help from another person toileting, which includes using toliet, bedpan, or urinal?: A Little Help from another person bathing (including washing, rinsing, drying)?: A Little Help from another person to put on and taking off regular upper body clothing?: A Little Help from another person to put on and taking off regular lower body clothing?: Total 6 Click Score: 16   End of Session Equipment Utilized During Treatment: Gait belt;Rolling walker Nurse Communication: Mobility status;Precautions  Activity Tolerance: Patient tolerated treatment well Patient left: in chair;with call bell/phone within reach;with  family/visitor present;with chair alarm set  OT Visit Diagnosis: Unsteadiness on feet (R26.81);Muscle weakness (generalized) (M62.81)                Time: 7948-0165 OT Time Calculation (min): 28 min Charges:  OT General Charges $OT Visit: 1 Visit OT  Evaluation $OT Eval Moderate Complexity: 1 Mod OT Treatments $Self Care/Home Management : 8-22 mins  Brynn, OTR/L  Acute Rehabilitation Services Pager: [phone] Office: [phone] .   Jeri Modena 08/04/2021, 4:50 PM

## 2021-08-04 NOTE — Progress Notes (Signed)
Physical Therapy Treatment Patient Details Name: Terry [name] MRN: [id number] DOB: 1924/08/08 Today's Date: 08/04/2021   History of Present Illness 87 y.o. male admitted on 08/01/21 for confusion.  Pt dx with AKI, AMS.  Pt with significant PMH of essential HTN, HTN, mitral valve regugitation, CABG, lumbar laminectomy, R knee arthroscopy.    PT Comments    Pt was able to get up and ambulate a similar distance compared to yesterday and generally the same assistance level, however, he is a bit slower to process, lethargic-looking and quieter today.  He is not as bright and alert.  He also has a productive cough, runny nose and DOE with gait (despite good vitals on RA throughout).  Dr. Posey Pronto made aware of changes.   PT to follow acutely for deficits listed below.     Recommendations for follow up therapy are one component of a multi-disciplinary discharge planning process, led by the attending physician.  Recommendations may be updated based on patient status, additional functional criteria and insurance authorization.  Follow Up Recommendations  Home health PT     Equipment Recommendations  None recommended by PT    Recommendations for Other Services       Precautions / Restrictions Precautions Precautions: Fall Precaution Comments: history of L foot drop (has had it for years since back surgery) does not wear a brace     Mobility  Bed Mobility               General bed mobility comments: Pt was OOB in the chair.    Transfers Overall transfer level: Needs assistance Equipment used: Rolling walker (2 wheeled) Transfers: Sit to/from Stand Sit to Stand: Min guard         General transfer comment: Min guard assist for safety, uncontrolled descent to sit "plop" down into the recliner chair.  Ambulation/Gait Ambulation/Gait assistance: Min guard Gait Distance (Feet): 50 Feet (50'x1, had to turn back to go to the bathroom, 50'x1, seated rest, 50'x1) Assistive device:  Rolling walker (2 wheeled) Gait Pattern/deviations: Step-through pattern;Decreased dorsiflexion - left;Steppage Gait velocity: decreased Gait velocity interpretation: 1.31 - 2.62 ft/sec, indicative of limited community ambulator General Gait Details: donned shoes today due to reports from OT of R heel pain (did not seem to bother him while walking, but donned shoes anyway), became SOB with walking today 2/4, O2 sats, HR, and BPs were all normal checked throughout session.   Stairs             Wheelchair Mobility    Modified Rankin (Stroke Patients Only)       Balance Overall balance assessment: Needs assistance Sitting-balance support: Feet supported;No upper extremity supported Sitting balance-Leahy Scale: Good Sitting balance - Comments: can sit figure 4 and donn shoes   Standing balance support: Bilateral upper extremity supported Standing balance-Leahy Scale: Poor Standing balance comment: needs external support in standing from therapist and RW.                            Cognition Arousal/Alertness: Lethargic Behavior During Therapy: Flat affect Overall Cognitive Status: Impaired/Different from baseline Area of Impairment: Problem solving                     Memory: Decreased short-term memory       Problem Solving: Slow processing General Comments: Pt appears fatigued, exhausted, slower to process today, less alert compared to yesterday, but still responsive to questions.  Does  not remember where we walked yesterday, but did recall accurately what he ate for lunch.      Exercises General Exercises - Upper Extremity Shoulder Flexion: AROM;Both;10 reps;Seated Elbow Flexion: AROM;Both;10 reps;Seated General Exercises - Lower Extremity Ankle Circles/Pumps: AROM;Both;10 reps;Seated Long Arc Quad: AROM;Both;10 reps;Seated Hip Flexion/Marching: AROM;Both;10 reps;Seated Toe Raises: AROM;Both;10 reps;Seated Heel Raises: AROM;Both;10  reps;Seated Mini-Sqauts: AROM;Both;10 reps;Standing    General Comments General comments (skin integrity, edema, etc.): Pt appears more lethargic, almost exhausted, has a gurgle to his voice quality, productive cough and runny nose that I did not notice yesterday.  MD, Dr. Posey Pronto, made aware of lethargy and new sx.      Pertinent Vitals/Pain      Home Living                      Prior Function            PT Goals (current goals can now be found in the care plan section) Acute Rehab PT Goals Patient Stated Goal: to go home and stay independent as long as possible. Progress towards PT goals: Progressing toward goals    Frequency    Min 3X/week      PT Plan Current plan remains appropriate    Co-evaluation              AM-PAC PT "6 Clicks" Mobility   Outcome Measure  Help needed turning from your back to your side while in a flat bed without using bedrails?: A Little Help needed moving from lying on your back to sitting on the side of a flat bed without using bedrails?: A Little Help needed moving to and from a bed to a chair (including a wheelchair)?: A Little Help needed standing up from a chair using your arms (e.g., wheelchair or bedside chair)?: A Little Help needed to walk in hospital room?: A Little Help needed climbing 3-5 steps with a railing? : A Little 6 Click Score: 18    End of Session Equipment Utilized During Treatment: Gait belt Activity Tolerance: Patient limited by fatigue Patient left: in chair;with call bell/phone within reach;with family/visitor present   PT Visit Diagnosis: Muscle weakness (generalized) (M62.81);Difficulty in walking, not elsewhere classified (R26.2)     Time: 4656-8127 PT Time Calculation (min) (ACUTE ONLY): 45 min  Charges:  $Gait Training: 23-37 mins $Therapeutic Exercise: 8-22 mins                     Verdene Lennert, PT, DPT  Acute Rehabilitation Ortho Tech Supervisor [phone] pager [phone]) [phone]  office

## 2021-08-04 NOTE — Discharge Instructions (Signed)
Nutrition Post Hospital Stay °Proper nutrition can help your body recover from illness and injury.   °Foods and beverages high in protein, vitamins, and minerals help rebuild muscle loss, promote healing, & reduce fall risk.  ° °•In addition to eating healthy foods, a nutrition shake is an easy, delicious way to get the nutrition you need during and after your hospital stay ° °It is recommended that you continue to drink 2 bottles per day of:       Ensure Plus for at least 1 month (30 days) after your hospital stay  ° °Tips for adding a nutrition shake into your routine: °As allowed, drink one with vitamins or medications instead of water or juice °Enjoy one as a tasty mid-morning or afternoon snack °Drink cold or make a milkshake out of it °Drink one instead of milk with cereal or snacks °Use as a coffee creamer °  °Available at the following grocery stores and pharmacies:           °* [employer] * [employer] * [employer]  °* [employer]          * Walmart * [employer]  °* Wal[facility]reens      * Target  * [employer]J's   °* [employer]S  * [employer]   °* [facility] [phone]  °          °For COUPONS visit: www.ensure.com/join or www.[email or web address].com/members/sign-[email or web address]  ° °Suggested Substitutions °Ensure Plus = Boost Plus = Carnation Breakfast Essentials = Boost Compact ° ° °  ° °

## 2021-08-05 DIAGNOSIS — I2510 Atherosclerotic heart disease of native coronary artery without angina pectoris: Secondary | ICD-10-CM | POA: Diagnosis not present

## 2021-08-05 DIAGNOSIS — Z66 Do not resuscitate: Secondary | ICD-10-CM | POA: Diagnosis not present

## 2021-08-05 DIAGNOSIS — R7989 Other specified abnormal findings of blood chemistry: Secondary | ICD-10-CM

## 2021-08-05 DIAGNOSIS — I10 Essential (primary) hypertension: Secondary | ICD-10-CM

## 2021-08-05 DIAGNOSIS — G9341 Metabolic encephalopathy: Secondary | ICD-10-CM | POA: Diagnosis not present

## 2021-08-05 DIAGNOSIS — N179 Acute kidney failure, unspecified: Secondary | ICD-10-CM | POA: Diagnosis not present

## 2021-08-05 DIAGNOSIS — R5381 Other malaise: Secondary | ICD-10-CM

## 2021-08-05 DIAGNOSIS — R638 Other symptoms and signs concerning food and fluid intake: Secondary | ICD-10-CM

## 2021-08-05 LAB — RENAL FUNCTION PANEL
Albumin: 3 g/dL — ABNORMAL LOW (ref 3.5–5.0)
Anion gap: 5 (ref 5–15)
BUN: 45 mg/dL — ABNORMAL HIGH (ref 8–23)
CO2: 29 mmol/L (ref 22–32)
Calcium: 8.8 mg/dL — ABNORMAL LOW (ref 8.9–10.3)
Chloride: 104 mmol/L (ref 98–111)
Creatinine, Ser: 1.38 mg/dL — ABNORMAL HIGH (ref 0.61–1.24)
GFR, Estimated: 47 mL/min — ABNORMAL LOW (ref >60)
Glucose, Bld: 106 mg/dL — ABNORMAL HIGH (ref 70–99)
Phosphorus: 3 mg/dL (ref 2.5–4.6)
Potassium: 4.6 mmol/L (ref 3.5–5.1)
Sodium: 138 mmol/L (ref 135–145)

## 2021-08-05 LAB — CULTURE, BLOOD (ROUTINE X 2): Special Requests: ADEQUATE

## 2021-08-05 MED ORDER — AMLODIPINE BESYLATE 10 MG PO TABS: 10.0000 mg | ORAL_TABLET | Freq: Every day | ORAL | 1 refills | Status: DC

## 2021-08-05 MED ORDER — MELATONIN 3 MG PO TABS: 6.0000 mg | ORAL_TABLET | Freq: Every evening | ORAL | 0 refills | Status: DC | PRN

## 2021-08-05 NOTE — Progress Notes (Signed)
OT Cancellation Note  Patient Details Name: EULALIO REAMY MRN: [id number] DOB: 1924/07/01   Cancelled Treatment:    Reason Eval/Treat Not Completed: Patient declined, no reason specified (eating lunch / pending d/c / daughter present and deferring to d/c home at this time.)  Billey Chang, OTR/L  Acute Rehabilitation Services Pager: [phone] Office: [phone] .  08/05/2021, 1:37 PM

## 2021-08-05 NOTE — Discharge Summary (Signed)
Physician Discharge Summary  Terry [name] [id number]:[id number] DOB: November 28, 1923 DOA: 08/01/2021  PCP: Alroy Dust, L.Marlou Sa, MD  Admit date: 08/01/2021 Discharge date: 08/05/2021 Admitted From: Home Disposition: Home Recommendations for Outpatient Follow-up:  Follow ups as below. Please obtain CBC/BMP/Mag at follow up Please follow up on the following pending results: None Home Health: PT/OT Equipment/Devices: Rolling walker and 3 in 1 commode Discharge Condition: Stable CODE STATUS: DNR/DNI  Follow-up Information     Alroy Dust, L.Marlou Sa, MD. Schedule an appointment as soon as possible for a visit in 1 week(s).   Specialty: Family Medicine Contact information: [address] [place] [address] [phone]                Hospital Course: 87 year old M with PMH of CAD/CABG, A. Fib and HTN presenting with confusion and poor p.o. intake, and admitted with AKI, altered mental status and dehydration. CR 3.94 with BUN of 96.  Renal ultrasound without significant finding other than 3.2 cm benign renal cyst.  He was started on IV fluid hydration with improvement in his renal function and mental status.  On the day of discharge, creatinine down to 1.38 which is about his baseline.  Mental status improved.  He was oriented to self, place and person.  He is discharged to follow-up with PCP.  Discontinued lisinopril and HCTZ in the setting of AKI, dehydration and poor p.o. intake.  Home health PT/OT and DME ordered as recommended by therapy.  See individual problem list below for more on hospital course.  Discharge Diagnoses:  AKI/azotemia on CKD-3A: Baseline Cr 1.36.  Likely prerenal.  Resolving. Recent Labs    08/01/21 2236 08/02/21 1207 08/03/21 0300 08/04/21 1035 08/05/21 0335  BUN 96* 89* 85* 59* 45*  CREATININE 3.94* 2.75* 2.08* 1.48* 1.38*  -Discontinue lisinopril and HCTZ -Recheck renal function at follow-up  Acute metabolic encephalopathy-could be from  azotemia/uremia and dehydration.  There is also some element of hospital delirium.  Improved -Management as above    Essential hypertension, benign: Normotensive. -Continue Toprol-XL and amlodipine. -Discontinue lisinopril and HCTZ due to risk for dehydration and AKI    CAD s/p CABG-no chest pain or anginal symptoms. -Continue ASA and Toprol-XL as above   Physical deconditioning -HH PT/OT/rolling walker and 3 in 1 commode  Mixed hyperlipidemia -Continue home Cozaar   DNR (do not resuscitate)  Increased nutrient needs/poor p.o. intake Body mass index is 25.22 kg/m. Nutrition Problem: Increased nutrient needs Etiology: acute illness Signs/Symptoms: estimated needs Interventions: MVI, Ensure Enlive (each supplement provides 350kcal and 20 grams of protein)  Pressure skin injury: POA Pressure Injury 08/02/21 Heel Right Deep Tissue Pressure Injury - Purple or maroon localized area of discolored intact skin or blood-filled blister due to damage of underlying soft tissue from pressure and/or shear. (Active)  08/02/21 1023  Location: Heel  Location Orientation: Right  Staging: Deep Tissue Pressure Injury - Purple or maroon localized area of discolored intact skin or blood-filled blister due to damage of underlying soft tissue from pressure and/or shear.  Wound Description (Comments):   Present on Admission: Yes    Discharge Exam: Vitals:   08/05/21 0014 08/05/21 0325 08/05/21 0747 08/05/21 1104  BP: (!) 135/59 (!) 135/53 (!) 121/53 (!) 115/52  Pulse: 68 69 70 76  Temp: 98.4 F (36.9 C) 98.7 F (37.1 C) 97.6 F (36.4 C) 97.8 F (36.6 C)  Resp: 20 20 14 15   Height:      Weight:      SpO2: 95%  98% 94% 98%  TempSrc: Oral Oral Oral Oral  BMI (Calculated):         GENERAL: No apparent distress.  Nontoxic. HEENT: MMM.  Vision and hearing grossly intact.  NECK: Supple.  No apparent JVD.  RESP: On RA.  No IWOB.  Fair aeration bilaterally. CVS:  RRR. Heart sounds normal.   ABD/GI/GU: Bowel sounds present. Soft. Non tender.  MSK/EXT:  Moves extremities. No apparent deformity. No edema.  SKIN: no apparent skin lesion or wound NEURO: Awake and alert.  Oriented to self, person and place.  No apparent focal neuro deficit. PSYCH: Calm. Normal affect.   Discharge Instructions  Discharge Instructions     Diet general   Complete by: As directed    Discharge instructions   Complete by: As directed    It has been a pleasure taking care of you!  You were hospitalized due to acute kidney injury and confusion.  Your kidney function and confusion improved.  We have stopped some of your blood pressure medications which could affect your kidney.  Please review your new medication list and the directions on your medications before you take them.  We also recommend you avoid any over-the-counter pain medication other than plain Tylenol.  Please follow-up with your primary care doctor in 1 week to have your kidney number rechecked.   Take care,   Increase activity slowly   Complete by: As directed    No wound care   Complete by: As directed       Allergies as of 08/05/2021   No Known Allergies      Medication List     STOP taking these medications    hydrochlorothiazide 25 MG tablet Commonly known as: HYDRODIURIL   lisinopril 20 MG tablet Commonly known as: ZESTRIL   magnesium chloride 64 MG Tbec SR tablet Commonly known as: SLOW-MAG   naproxen sodium 220 MG tablet Commonly known as: ALEVE       TAKE these medications    amLODipine 10 MG tablet Commonly known as: NORVASC Take 1 tablet (10 mg total) by mouth daily. What changed:  medication strength how much to take   aspirin 81 MG tablet Take 81 mg by mouth daily.   CENTRUM ADULTS PO Take 1 tablet by mouth daily.   Fish Oil 1000 MG Caps Take 1,000 mg by mouth 2 (two) times daily.   ipratropium 0.03 % nasal spray Commonly known as: ATROVENT Place 2 sprays into both nostrils 2 (two)  times daily as needed for rhinitis.   melatonin 3 MG Tabs tablet Take 2 tablets (6 mg total) by mouth at bedtime as needed (sleep). What changed: how much to take   metoprolol succinate 25 MG 24 hr tablet Commonly known as: TOPROL-XL Take 12.5 mg by mouth daily.   nitroGLYCERIN 0.4 MG SL tablet Commonly known as: NITROSTAT Place 1 tablet (0.4 mg total) under the tongue every 5 (five) minutes as needed for chest pain.   simvastatin 40 MG tablet Commonly known as: ZOCOR Take 20 mg by mouth at bedtime.   tamsulosin 0.4 MG Caps capsule Commonly known as: FLOMAX Take 0.4 mg by mouth daily.   triamcinolone cream 0.5 % Commonly known as: KENALOG Apply 1 application topically 2 (two) times daily as needed (rash, itching).               Durable Medical Equipment  (From admission, onward)           Start  Ordered   08/05/21 1334  For home use only DME 3 n 1  Once        08/05/21 1333   08/05/21 1334  For home use only DME Walker rolling  Once       Question Answer Comment  Walker: With 5 Inch Wheels   Patient needs a walker to treat with the following condition Generalized weakness      08/05/21 1333            Consultations: None  Procedures/Studies:   CT Head Wo Contrast  Result Date: 08/01/2021 CLINICAL DATA:  Altered mental status. EXAM: CT HEAD WITHOUT CONTRAST TECHNIQUE: Contiguous axial images were obtained from the base of the skull through the vertex without intravenous contrast. COMPARISON:  None. BRAIN: BRAIN Cerebral ventricle sizes are concordant with the degree of cerebral volume loss. Patchy and confluent areas of decreased attenuation are noted throughout the deep and periventricular white matter of the cerebral hemispheres bilaterally, compatible with chronic microvascular ischemic disease. No evidence of large-territorial acute infarction. No parenchymal hemorrhage. No mass lesion. No extra-axial collection. No mass effect or midline shift.  No hydrocephalus. Basilar cisterns are patent. Vascular: No hyperdense vessel. Atherosclerotic calcifications are present within the cavernous internal carotid and vertebral arteries. Skull: No acute fracture or focal lesion. Sinuses/Orbits: Paranasal sinuses and mastoid air cells are clear. Left lens replacement. Otherwise the orbits are unremarkable. Other: None. IMPRESSION: No acute intracranial abnormality. Electronically Signed   By: Iven Finn M.D.   On: 08/01/2021 23:15   US RENAL  Result Date: 08/02/2021 CLINICAL DATA:  Acute renal insufficiency EXAM: RENAL / URINARY TRACT ULTRASOUND COMPLETE COMPARISON:  None. FINDINGS: Right Kidney: Renal measurements: 10.9 x 5.2 x 4.0 cm = volume: 108.5 mL. Echogenicity within normal limits. No hydronephrosis visualized. There is a 3.2 x 2.3 x 2.5 cm benign-appearing exophytic cyst off of the superior pole of the right kidney. Left Kidney: Renal measurements: 11.3 x 5.9 x 4.1 cm = volume: 142.2 mL. Echogenicity within normal limits. No mass or hydronephrosis visualized. Bladder: Appears normal for degree of bladder distention. Prevoid volume is 439 cc. Bilateral ureteral jets are seen. Other: None. IMPRESSION: Normal renal ultrasound, apart from benign-appearing 3.2 cm right renal cyst. Electronically Signed   By: Fidela [place] M.D.   On: 08/02/2021 15:15   DG CHEST PORT 1 VIEW  Result Date: 08/04/2021 CLINICAL DATA:  Shortness of breath EXAM: PORTABLE CHEST 1 VIEW COMPARISON:  08/01/2021 FINDINGS: Postoperative changes in the mediastinum. Mild cardiac enlargement. No vascular congestion or edema. Slight fibrosis or linear atelectasis in the left lung base. Probable small left pleural effusion. No consolidation in the lungs. No pneumothorax. Mediastinal contours appear intact. Calcification of the aorta. IMPRESSION: Developing small left pleural effusion with minimal atelectasis or fibrosis in the left base. Mild cardiac enlargement. Electronically  Signed   By: Lucienne Capers M.D.   On: 08/04/2021 19:46   DG Chest Portable 1 View  Result Date: 08/01/2021 CLINICAL DATA:  Cough. Altered mental status. Chills. Possible infection. EXAM: PORTABLE CHEST 1 VIEW COMPARISON:  11/20/2007 FINDINGS: Postoperative changes in the mediastinum. Heart size and pulmonary vascularity are normal. Linear atelectasis or fibrosis in the lung bases is unchanged since prior study. No airspace disease or consolidation. No pleural effusions. No pneumothorax. Mediastinal contours appear intact. Calcification of the aorta. Degenerative changes in the spine and shoulders. IMPRESSION: No evidence of active pulmonary disease. Linear fibrosis or atelectasis in the lung bases. Electronically Signed   By:  Lucienne Capers M.D.   On: 08/01/2021 23:19       The results of significant diagnostics from this hospitalization (including imaging, microbiology, ancillary and laboratory) are listed below for reference.     Microbiology: Recent Results (from the past 240 hour(s))  Blood culture (routine x 2)     Status: None (Preliminary result)   Collection Time: 08/01/21 10:00 PM   Specimen: BLOOD  Result Value Ref Range Status   Specimen Description   Final    BLOOD RIGHT FOREARM Performed at [facility], [address] [place], [place] [address]    Special Requests   Final    BOTTLES DRAWN AEROBIC AND ANAEROBIC Blood Culture adequate volume Performed at [facility], [address], [place], [place] [address]    Culture   Final    NO GROWTH 3 DAYS Performed at [facility], [place] [address], [place], [place] [address]    Report Status PENDING  Incomplete  Resp Panel by RT-PCR (Flu A&B, Covid) Nasopharyngeal Swab     Status: None   Collection Time: 08/01/21 10:56 PM   Specimen: Nasopharyngeal Swab; Nasopharyngeal(NP) swabs in vial transport medium  Result Value Ref Range Status   SARS Coronavirus 2 by RT PCR NEGATIVE  NEGATIVE Final    Comment: (NOTE) SARS-CoV-2 target nucleic acids are NOT DETECTED.  The SARS-CoV-2 RNA is generally detectable in upper respiratory specimens during the acute phase of infection. The lowest concentration of SARS-CoV-2 viral copies this assay can detect is 138 copies/mL. A negative result does not preclude SARS-Cov-2 infection and should not be used as the sole basis for treatment or other patient management decisions. A negative result may occur with  improper specimen collection/handling, submission of specimen other than nasopharyngeal swab, presence of viral mutation(s) within the areas targeted by this assay, and inadequate number of viral copies(<138 copies/mL). A negative result must be combined with clinical observations, patient history, and epidemiological information. The expected result is Negative.  Fact Sheet for Patients:  EntrepreneurPulse.com.au  Fact Sheet for Healthcare Providers:  IncredibleEmployment.be  This test is no t yet approved or cleared by the [place] FDA and  has been authorized for detection and/or diagnosis of SARS-CoV-2 by FDA under an Emergency Use Authorization (EUA). This EUA will remain  in effect (meaning this test can be used) for the duration of the COVID-19 declaration under Section 564(b)(1) of the Act, 21 U.S.C.section 360bbb-3(b)(1), unless the authorization is terminated  or revoked sooner.       Influenza A by PCR NEGATIVE NEGATIVE Final   Influenza B by PCR NEGATIVE NEGATIVE Final    Comment: (NOTE) The Xpert Xpress SARS-CoV-2/FLU/RSV plus assay is intended as an aid in the diagnosis of influenza from Nasopharyngeal swab specimens and should not be used as a sole basis for treatment. Nasal washings and aspirates are unacceptable for Xpert Xpress SARS-CoV-2/FLU/RSV testing.  Fact Sheet for Patients: EntrepreneurPulse.com.au  Fact Sheet for Healthcare  Providers: IncredibleEmployment.be  This test is not yet approved or cleared by the [place] FDA and has been authorized for detection and/or diagnosis of SARS-CoV-2 by FDA under an Emergency Use Authorization (EUA). This EUA will remain in effect (meaning this test can be used) for the duration of the COVID-19 declaration under Section 564(b)(1) of the Act, 21 U.S.C. section 360bbb-3(b)(1), unless the authorization is terminated or revoked.  Performed at [facility], [address] [place], [place] [address]   Blood culture (routine x 2)  Status: Abnormal   Collection Time: 08/01/21 10:56 PM   Specimen: BLOOD  Result Value Ref Range Status   Specimen Description   Final    BLOOD LEFT FOREARM Performed at [facility], [address], [place], [place] [address]    Special Requests   Final    BOTTLES DRAWN AEROBIC AND ANAEROBIC Blood Culture adequate volume Performed at [facility], [address] [place], [place] [address]    Culture  Setup Time   Final    GRAM POSITIVE COCCI AEROBIC BOTTLE ONLY Organism ID to follow CRITICAL RESULT CALLED TO, READ BACK BY AND VERIFIED WITH: LAWLESS PHARMD 08/03/21 0841 JDW    Culture (A)  Final    STAPHYLOCOCCUS HOMINIS THE SIGNIFICANCE OF ISOLATING THIS ORGANISM FROM A SINGLE SET OF BLOOD CULTURES WHEN MULTIPLE SETS ARE DRAWN IS UNCERTAIN. PLEASE NOTIFY THE MICROBIOLOGY DEPARTMENT WITHIN ONE WEEK IF SPECIATION AND SENSITIVITIES ARE REQUIRED. Performed at [facility], [place] [address] [place], [place] [address]    Report Status 08/05/2021 FINAL  Final  Blood Culture ID Panel (Reflexed)     Status: Abnormal   Collection Time: 08/01/21 10:56 PM  Result Value Ref Range Status   Enterococcus faecalis NOT DETECTED NOT DETECTED Final   Enterococcus Faecium NOT DETECTED NOT DETECTED Final   Listeria monocytogenes NOT DETECTED NOT DETECTED Final    Staphylococcus species DETECTED (A) NOT DETECTED Final    Comment: CRITICAL RESULT CALLED TO, READ BACK BY AND VERIFIED WITH: LAWLESS PHARMD 08/03/21 0841 JDW    Staphylococcus aureus (BCID) NOT DETECTED NOT DETECTED Final   Staphylococcus epidermidis NOT DETECTED NOT DETECTED Final   Staphylococcus lugdunensis NOT DETECTED NOT DETECTED Final   Streptococcus species NOT DETECTED NOT DETECTED Final   Streptococcus agalactiae NOT DETECTED NOT DETECTED Final   Streptococcus pneumoniae NOT DETECTED NOT DETECTED Final   Streptococcus pyogenes NOT DETECTED NOT DETECTED Final   A.calcoaceticus-baumannii NOT DETECTED NOT DETECTED Final   Bacteroides fragilis NOT DETECTED NOT DETECTED Final   Enterobacterales NOT DETECTED NOT DETECTED Final   Enterobacter cloacae complex NOT DETECTED NOT DETECTED Final   Escherichia coli NOT DETECTED NOT DETECTED Final   Klebsiella aerogenes NOT DETECTED NOT DETECTED Final   Klebsiella oxytoca NOT DETECTED NOT DETECTED Final   Klebsiella pneumoniae NOT DETECTED NOT DETECTED Final   Proteus species NOT DETECTED NOT DETECTED Final   Salmonella species NOT DETECTED NOT DETECTED Final   Serratia marcescens NOT DETECTED NOT DETECTED Final   Haemophilus influenzae NOT DETECTED NOT DETECTED Final   Neisseria meningitidis NOT DETECTED NOT DETECTED Final   Pseudomonas aeruginosa NOT DETECTED NOT DETECTED Final   Stenotrophomonas maltophilia NOT DETECTED NOT DETECTED Final   Candida albicans NOT DETECTED NOT DETECTED Final   Candida auris NOT DETECTED NOT DETECTED Final   Candida glabrata NOT DETECTED NOT DETECTED Final   Candida krusei NOT DETECTED NOT DETECTED Final   Candida parapsilosis NOT DETECTED NOT DETECTED Final   Candida tropicalis NOT DETECTED NOT DETECTED Final   Cryptococcus neoformans/gattii NOT DETECTED NOT DETECTED Final    Comment: Performed at [facility], [place]. [address] [place], [place] [address]  Urine Culture     Status: None    Collection Time: 08/02/21  1:00 AM   Specimen: Urine, Clean Catch  Result Value Ref Range Status   Specimen Description   Final    URINE, CLEAN CATCH Performed at [facility], [address] [place],  [address]  Special Requests   Final    NONE Performed at [facility], [address], [place], [place] [address]    Culture   Final    NO GROWTH Performed at [facility], [place] [address], [place], [place] [address]    Report Status 08/03/2021 FINAL  Final  MRSA Next Gen by PCR, Nasal     Status: None   Collection Time: 08/02/21 10:26 AM   Specimen: Nasal Mucosa; Nasal Swab  Result Value Ref Range Status   MRSA by PCR Next Gen NOT DETECTED NOT DETECTED Final    Comment: (NOTE) The GeneXpert MRSA Assay (FDA approved for NASAL specimens only), is one component of a comprehensive MRSA colonization surveillance program. It is not intended to diagnose MRSA infection nor to guide or monitor treatment for MRSA infections. Test performance is not FDA approved in patients less than 16 years old. Performed at [facility], [place] [address], [place], [place] [address]      Labs:  CBC: Recent Labs  Lab 08/01/21 2236 08/03/21 0300  WBC 14.3* 10.3  NEUTROABS 8.6*  --   HGB 11.9* 11.6*  HCT 36.4* 34.9*  MCV 90.8 90.2  PLT 250 202   BMP &GFR Recent Labs  Lab 08/01/21 2236 08/02/21 1207 08/03/21 0300 08/04/21 1035 08/05/21 0335  NA 145 138 135 137 138  K 4.2 4.3 4.5 4.7 4.6  CL 106 102 102 104 104  CO2 24 26 24 28 29   GLUCOSE 123* 117* 100* 124* 106*  BUN 96* 89* 85* 59* 45*  CREATININE 3.94* 2.75* 2.08* 1.48* 1.38*  CALCIUM 9.0 8.7* 8.4* 8.6* 8.8*  PHOS  --   --   --  2.8 3.0   Estimated Creatinine Clearance: 36.2 mL/min (A) (by C-G formula based on SCr of 1.38 mg/dL (H)). Liver & Pancreas: Recent Labs  Lab 08/01/21 2236 08/04/21 1035 08/05/21 0335  AST 21  --   --   ALT 14  --   --   ALKPHOS 63  --    --   BILITOT 0.6  --   --   PROT 7.9  --   --   ALBUMIN 4.2 2.9* 3.0*   Recent Labs  Lab 08/01/21 2236  LIPASE 27   No results for input(s): AMMONIA in the last 168 hours. Diabetic: No results for input(s): HGBA1C in the last 72 hours. Recent Labs  Lab 08/01/21 2220  GLUCAP 109*   Cardiac Enzymes: No results for input(s): CKTOTAL, CKMB, CKMBINDEX, TROPONINI in the last 168 hours. No results for input(s): PROBNP in the last 8760 hours. Coagulation Profile: No results for input(s): INR, PROTIME in the last 168 hours. Thyroid Function Tests: No results for input(s): TSH, T4TOTAL, FREET4, T3FREE, THYROIDAB in the last 72 hours. Lipid Profile: No results for input(s): CHOL, HDL, LDLCALC, TRIG, CHOLHDL, LDLDIRECT in the last 72 hours. Anemia Panel: No results for input(s): VITAMINB12, FOLATE, FERRITIN, TIBC, IRON, RETICCTPCT in the last 72 hours. Urine analysis:    Component Value Date/Time   COLORURINE YELLOW 08/02/2021 0100   APPEARANCEUR CLEAR 08/02/2021 0100   LABSPEC 1.019 08/02/2021 0100   PHURINE 5.0 08/02/2021 0100   GLUCOSEU NEGATIVE 08/02/2021 0100   HGBUR NEGATIVE 08/02/2021 0100   BILIRUBINUR NEGATIVE 08/02/2021 0100   KETONESUR NEGATIVE 08/02/2021 0100   PROTEINUR NEGATIVE 08/02/2021 0100   UROBILINOGEN 1.0 06/05/2007 1612   NITRITE NEGATIVE 08/02/2021 0100   LEUKOCYTESUR NEGATIVE 08/02/2021 0100   Sepsis Labs: Invalid input(s): PROCALCITONIN,  LACTICIDVEN   Time coordinating discharge: 45 minutes  SIGNED:  Mercy Riding, MD  [facility] 08/05/2021, 6:25 PM

## 2021-08-05 NOTE — Progress Notes (Signed)
Pt discharged home. All belongings packed, PIV removed, daughter at bedside and received discharge information. Pt taken to private vehicle in wheelchair.   Justice Rocher, RN

## 2021-08-05 NOTE — TOC Progression Note (Signed)
Transition of Care [facility]) - Progression Note    Patient Details  Name: Terry [name] MRN: [id number] Date of Birth: [birth year]  [facility] CM/SW Contact  Loletha Grayer Beverely Pace, RN Phone Number: 08/05/2021, 10:22 AM  Clinical Narrative:  Case manager received call from Corliss Blacker with [employer]. He states he has spoken with patient's daughter and she is interested in having weekend care for her dad, they have the weekdays covered. Chana Bode will begin providing service on 08/15/21.  TOC Team will continue to follow,.    Expected Discharge Plan: [place] Barriers to Discharge: Continued Medical Work up  Expected Discharge Plan and Services Expected Discharge Plan: [place]   Discharge Planning Services: CM Consult Post Acute Care Choice: Home Health, Resumption of Svcs/PTA Provider Living arrangements for the past 2 months: Single Family Home                           HH Arranged: PT, OT [place] Agency: [place] ([facility]) Date HH Agency Contacted: 08/04/21 Time HH Agency Contacted: 1430 Representative spoke with at [place]: [place] ([place]) Interventions    Readmission Risk Interventions No flowsheet data found.

## 2021-08-07 LAB — CULTURE, BLOOD (ROUTINE X 2)
Culture: NO GROWTH
Special Requests: ADEQUATE

## 2021-08-13 DIAGNOSIS — N179 Acute kidney failure, unspecified: Secondary | ICD-10-CM | POA: Diagnosis not present

## 2021-08-13 DIAGNOSIS — I10 Essential (primary) hypertension: Secondary | ICD-10-CM | POA: Diagnosis not present

## 2021-08-13 DIAGNOSIS — Z23 Encounter for immunization: Secondary | ICD-10-CM | POA: Diagnosis not present

## 2021-08-13 DIAGNOSIS — R410 Disorientation, unspecified: Secondary | ICD-10-CM | POA: Diagnosis not present

## 2021-10-08 DIAGNOSIS — I10 Essential (primary) hypertension: Secondary | ICD-10-CM | POA: Diagnosis not present

## 2021-10-08 DIAGNOSIS — W19XXXA Unspecified fall, initial encounter: Secondary | ICD-10-CM | POA: Diagnosis not present

## 2021-11-12 NOTE — Progress Notes (Signed)
Cardiology Office Note   Date:  11/13/2021   ID:  Terry [name], DOB [birth year], MRN [id number]  PCP:  Terry [name], Terry Jews.Marlou Sa, MD    Chief Complaint  Patient presents with   Follow-up   CAD  Wt Readings from Last 3 Encounters:  11/13/21 196 lb (88.9 kg)  08/02/21 191 lb 2.2 oz (86.7 kg)  07/17/20 218 lb 6.4 oz (99.1 kg)       History of Present Illness: Terry [name] is a 88 y.o. male  who has CAD.  Coronary artery bypass grafting x3 (left internal mammary artery to      left anterior descending, saphenous vein graft to circumflex      marginal, saphenous vein graft to posterior descending) in 2008 with Mitral valve annuloplasty repair for ischemic mitral regurgitation      with a 28 mm McCarthy-Adams ring.   Left atrial and right atrial MAZE procedure for preoperative atrial      flutter.   Stapling of left atrial appendage.   He did exercise at [name] in [place] 3x/week before COVID-19.   In the past, used weights and bike at home.     In 2019, he had some DOE.  Plan was :"COuld increase HCTZ to one full tab per week, if his swelling gets worse or if there are other signs of volume overload. "  In 2022, he was hospitalized: "88 year old M with PMH of CAD/CABG, A. Fib and HTN presenting with confusion and poor p.o. intake, and admitted with AKI, altered mental status and dehydration. CR 3.94 with BUN of 96.  Renal ultrasound without significant finding other than 3.2 cm benign renal cyst.  He was started on IV fluid hydration with improvement in his renal function and mental status.  On the day of discharge, creatinine down to 1.38 which is about his baseline.  Mental status improved.  He was oriented to self, place and person.  He is discharged to follow-up with PCP.  Discontinued lisinopril and HCTZ in the setting of AKI, dehydration and poor p.o. intake.  Home health PT/OT and DME ordered as recommended by therapy."     He has some chronic rhinorrhea.  Worse when he eats.   Daughter wondering if it is related to allergies.     Gave up driving after family recs.  He sometimes found the keys and did some driving.   Appetite is good.  Family reports that he is not always careful about avoiding falling.  He sometimes uses his cane and sometimes does not.    Past Medical History:  Diagnosis Date   Arrhythmia    atrial fib   Coronary artery disease    Coronary atherosclerosis 12/28/2013   Mitral valve annuloplasty repair for ischemic mitral regurgitation      with a 28 mm McCarthy-Adams ring.   Left atrial and right atrial MAZE procedure for preoperative atrial      flutter.   Stapling of left atrial appendage. Coronary artery bypass grafting x3 (left internal mammary artery to      left anterior descending, saphenous vein graft to circumflex      marginal, saphenous vein graft to    Edema 05/22/2014   Left ankle slightly larger than right-prior vein harvest    Essential hypertension, benign 12/28/2013   Hypertension    Low back syndrome    Dr. Nelva [name]   Mitral valve regurgitation, ischemic 06/04/2015   S/p repair in 2008     Past Surgical History:  Procedure Laterality Date   CORONARY ARTERY BYPASS GRAFT  2008   MV REPLACEMENT   INGUINAL HERNIA REPAIR Bilateral    LUMBAR LAMINECTOMY     RIGHT KNEE ARTHROSCOPY       Current Outpatient Medications  Medication Sig Dispense Refill   amLODipine (NORVASC) 5 MG tablet Take 5 mg by mouth daily.     aspirin 81 MG tablet Take 81 mg by mouth daily.     ipratropium (ATROVENT) 0.03 % nasal spray Place 2 sprays into both nostrils 2 (two) times daily as needed for rhinitis.     metoprolol succinate (TOPROL-XL) 50 MG 24 hr tablet Take 25 mg by mouth daily.     nitroGLYCERIN (NITROSTAT) 0.4 MG SL tablet Place 1 tablet (0.4 mg total) under the tongue every 5 (five) minutes as needed for chest pain. 25 tablet 3   tamsulosin (FLOMAX) 0.4 MG CAPS capsule Take 0.4 mg by mouth daily.     No current facility-administered  medications for this visit.    Allergies:   Patient has no known allergies.    Social History:  The patient  reports that he has quit smoking. He has never used smokeless tobacco.   Family History:  The patient's family history includes Heart attack in his brother, father, and another family member; Heart disease in his son.    ROS:  Please see the history of present illness.   Otherwise, review of systems are positive for symptoms of depression per the family.   All other systems are reviewed and negative.    PHYSICAL EXAM: VS:  BP 134/64    Pulse 72    Ht 6' 1.5" (1.867 m)    Wt 196 lb (88.9 kg)    SpO2 97%    BMI 25.51 kg/m  , BMI Body mass index is 25.51 kg/m. GEN: Well nourished, well developed, in no acute distress HEENT: normal Neck: no JVD, carotid bruits, or masses Cardiac: RRR; no murmurs, rubs, or gallops,no edema  Respiratory:  clear to auscultation bilaterally, normal work of breathing GI: soft, nontender, nondistended, + BS MS: no deformity or atrophy Skin: warm and dry, no rash Neuro:  Strength and sensation are intact Psych: euthymic mood, flat affect   EKG:   The ekg ordered today demonstrates normal sinus rhythm, prolonged PR interval, right bundle branch block   Recent Labs: 08/01/2021: ALT 14; B Natriuretic Peptide 241.6 08/03/2021: Hemoglobin 11.6; Platelets 202 08/05/2021: BUN 45; Creatinine, Ser 1.38; Potassium 4.6; Sodium 138   Lipid Panel    Component Value Date/Time   CHOL  05/30/2007 1837    154        ATP III CLASSIFICATION:  <200     mg/dL   Desirable  200-239  mg/dL   Borderline High  >=240    mg/dL   High   TRIG 65 05/30/2007 1837   HDL 52 05/30/2007 1837   CHOLHDL 3.0 05/30/2007 1837   VLDL 13 05/30/2007 1837   LDLCALC  05/30/2007 1837    89        Total Cholesterol/HDL:CHD Risk Coronary Heart Disease Risk Table                     Men   Women  1/2 Average Risk   3.4   3.3     Other studies Reviewed: Additional studies/  records that were reviewed today with results demonstrating: .   ASSESSMENT AND PLAN:  CAD: No angina.  We will refill  sublingual nitroglycerin. Hyperlipidemia: Continue simvastatin.  Would not check further lipids at this time. PreDM: He has been prediabetic in the past.  He has now lost weight and has had issues with dehydration.  I think it is fine for him to eat what ever he enjoys at age 43. Leg edema: Elevate legs Family realizes he is approaching end-of-life.  They feel like mentally, he has given up.  I think it is fine for him to eat things he enjoys.  I stressed the importance of avoiding falls.  He needs to drink plenty of water to avoid dehydration as well.  They have made him a DNR as well.   Current medicines are reviewed at length with the patient today.  The patient concerns regarding his medicines were addressed.  The following changes have been made:  No change  Labs/ tests ordered today include:  No orders of the defined types were placed in this encounter.   Recommend 150 minutes/week of aerobic exercise Low fat, low carb, high fiber diet recommended  Disposition:   FU in 1 year   Signed, Larae Grooms, MD  11/13/2021 12:01 PM    [facility] [place], [place], [place]  [address] Phone: [phone]; Fax: [phone]

## 2021-11-13 ENCOUNTER — Encounter: Payer: Self-pay | Admitting: Interventional Cardiology

## 2021-11-13 ENCOUNTER — Ambulatory Visit: Payer: Medicare HMO | Admitting: Interventional Cardiology

## 2021-11-13 ENCOUNTER — Other Ambulatory Visit: Payer: Self-pay

## 2021-11-13 VITALS — BP 134/64 | HR 72 | Ht 73.5 in | Wt 196.0 lb

## 2021-11-13 DIAGNOSIS — I4510 Unspecified right bundle-branch block: Secondary | ICD-10-CM | POA: Diagnosis not present

## 2021-11-13 DIAGNOSIS — I2510 Atherosclerotic heart disease of native coronary artery without angina pectoris: Secondary | ICD-10-CM | POA: Diagnosis not present

## 2021-11-13 DIAGNOSIS — E782 Mixed hyperlipidemia: Secondary | ICD-10-CM | POA: Diagnosis not present

## 2021-11-13 DIAGNOSIS — I10 Essential (primary) hypertension: Secondary | ICD-10-CM

## 2021-11-13 DIAGNOSIS — R7303 Prediabetes: Secondary | ICD-10-CM

## 2021-11-13 MED ORDER — NITROGLYCERIN 0.4 MG SL SUBL: 0.4000 mg | SUBLINGUAL_TABLET | SUBLINGUAL | 3 refills | Status: AC | PRN

## 2021-11-13 NOTE — Addendum Note (Signed)
Addended byEarley Favor, Shanna Un L on: 11/13/2021 01:00 PM   Modules accepted: Orders

## 2021-11-13 NOTE — Patient Instructions (Signed)

## 2021-12-15 ENCOUNTER — Telehealth: Payer: Self-pay | Admitting: Family Medicine

## 2021-12-15 DIAGNOSIS — D692 Other nonthrombocytopenic purpura: Secondary | ICD-10-CM | POA: Diagnosis not present

## 2021-12-15 DIAGNOSIS — F39 Unspecified mood [affective] disorder: Secondary | ICD-10-CM | POA: Diagnosis not present

## 2021-12-15 DIAGNOSIS — I10 Essential (primary) hypertension: Secondary | ICD-10-CM | POA: Diagnosis not present

## 2021-12-15 DIAGNOSIS — R2689 Other abnormalities of gait and mobility: Secondary | ICD-10-CM | POA: Diagnosis not present

## 2021-12-15 DIAGNOSIS — J310 Chronic rhinitis: Secondary | ICD-10-CM | POA: Diagnosis not present

## 2021-12-15 DIAGNOSIS — E782 Mixed hyperlipidemia: Secondary | ICD-10-CM | POA: Diagnosis not present

## 2021-12-15 DIAGNOSIS — N1831 Chronic kidney disease, stage 3a: Secondary | ICD-10-CM | POA: Diagnosis not present

## 2021-12-15 DIAGNOSIS — G4700 Insomnia, unspecified: Secondary | ICD-10-CM | POA: Diagnosis not present

## 2021-12-15 NOTE — Telephone Encounter (Signed)
[facility] Palliative Mychart visit scheduled for 12-18-21 at 12:30.

## 2021-12-18 ENCOUNTER — Other Ambulatory Visit: Payer: Self-pay

## 2021-12-18 ENCOUNTER — Telehealth: Payer: Medicare HMO | Admitting: Family Medicine

## 2021-12-21 ENCOUNTER — Other Ambulatory Visit: Payer: Self-pay

## 2021-12-21 ENCOUNTER — Telehealth: Payer: Medicare HMO | Admitting: Family Medicine

## 2021-12-21 DIAGNOSIS — R5381 Other malaise: Secondary | ICD-10-CM | POA: Diagnosis not present

## 2021-12-21 DIAGNOSIS — Z515 Encounter for palliative care: Secondary | ICD-10-CM | POA: Diagnosis not present

## 2021-12-21 NOTE — Progress Notes (Signed)
[facility] Consult Note Telephone: [phone]  Fax: [phone]   Date of encounter: 12/21/21 12:46 PM PATIENT NAME: Terry [name] [address] [place] [address]   [phone] (home)  DOB: 13-Apr-1924 MRN: [id number] PRIMARY CARE PROVIDER:    Alroy [name], L.Terry Sa, MD,  [place] [address] [employer] [address] [place] [address] [phone]  REFERRING PROVIDER:   Alroy [name], L.Terry [name], [place] [address] [place],  [place] [address] [phone]  RESPONSIBLE PARTY:    Contact Information     Name Relation Home Work Mobile   [name] Son   [phone]   [name] Daughter   [phone]      I connected with  Terry [name] on 12/27/21 by a video enabled telemedicine application and verified that I am speaking with the correct person using two identifiers. Spoke with daughter Terry [name].   I discussed the limitations of evaluation and management by telemedicine. The patient expressed understanding and agreed to proceed.   Palliative Care was asked to follow this patient by consultation request of  Terry [name], L.Terry Sa, MD to address advance care planning and complex medical decision making. This is the initial visit.          ASSESSMENT, SYMPTOM MANAGEMENT AND PLAN / RECOMMENDATIONS:   Palliative Care Encounter Has DNR, wants to remain in home. Need to address MOST at in-person visit   Physical deconditioning with recurrent falls Advised pt that use of Benadryl is drying and may contribute to balance issues as well as poorer Bp control especially when combined with Temazepam.   Follow up Palliative Care Visit: Palliative care will continue to follow for complex medical decision making, advance care planning, and clarification of goals. Return 2 weeks or prn.    This visit was coded based on medical decision making (MDM).  PPS: 50%  HOSPICE ELIGIBILITY/DIAGNOSIS: TBD  Chief Complaint:  Terry [name] received a referral to follow up with patient for chronic disease management, advance directive and defining/refining goals of care in 88 year old gentleman with heart disease and frequent falls.  HISTORY OF PRESENT ILLNESS:  Terry [name] is a 88 y.o. year old male with CAD s/p CABG, BPH, allergies and hypertension.  Family requesting someone to come.  They have someone coming in 4 hours daily x 7 days per week to help out, make sure pt has meals and takes meds.  Daughter states that pt has had multiple falls and most are in the living room when going around a corner of some furniture.  Pt denies CP, SOB, nausea, vomiting, constipation, dysuria.  Reports good appetite. Has had multiple falls including one where he fell in the bathtub.  The family has an emergency alert button but pt did not use it on the day he fell in the bathtub.  Daughter states he has not been seriously injured. Daughter indicates he uses Temazepam and Benadryl for sleep every night.  History obtained from review of EMR, discussion with daughter and/or Terry [name].  I reviewed available labs, medications, imaging, studies and related documents from the EMR.  Records reviewed and summarized above.   ROS General: NAD EYES: denies vision changes ENMT: denies dysphagia, has some trouble with his teeth and considering dentures Cardiovascular: denies chest pain, denies DOE Pulmonary: denies cough, denies increased SOB Abdomen: endorses good appetite, denies constipation, endorses continence of bowel GU: denies dysuria, endorses continence of urine MSK:  denies increased weakness, Repetitive falls Skin: denies rashes or  wounds Neurological: denies pain, endorses insomnia with use of Benadryl and Temazepam for sleep Psych: Endorses positive mood Heme/lymph/immuno: denies bruises, abnormal bleeding  Physical Exam: limited to observation Current and past weights: 196 lbs as of 11/13/21, weight 08/01/21 was 190.74  lbs Constitutional: NAD General: Thin ENMT: intact hearing CV: No noted cyanosis, can speak in complete sentences without having to stop to breathe Pulmonary: no visible increased work of breathing, audible dyspnea or wheezing Neuro:  no generalized weakness Psych: non-anxious affect, A and O x 3   CURRENT PROBLEM LIST:  Patient Active Problem List   Diagnosis Date Noted   Physical deconditioning 08/04/2021   AKI (acute kidney injury) ([place]) 08/02/2021   DNR (do not resuscitate) 29/52/8413   Acute metabolic encephalopathy 24/40/1027   RBBB 06/10/2017   Edema 05/22/2014   Coronary atherosclerosis 12/28/2013   Essential hypertension, benign 12/28/2013   Mixed hyperlipidemia 12/28/2013   PAST MEDICAL HISTORY:  Active Ambulatory Problems    Diagnosis Date Noted   Coronary atherosclerosis 12/28/2013   Essential hypertension, benign 12/28/2013   Mixed hyperlipidemia 12/28/2013   Edema 05/22/2014   RBBB 06/10/2017   AKI (acute kidney injury) ([place]) 08/02/2021   DNR (do not resuscitate) 25/36/6440   Acute metabolic encephalopathy 34/74/2595   Physical deconditioning 08/04/2021   Resolved Ambulatory Problems    Diagnosis Date Noted   Mitral valve regurgitation, ischemic 06/04/2015   Past Medical History:  Diagnosis Date   Arrhythmia    Coronary artery disease    Hypertension    Low back syndrome    SOCIAL HX:  Social History   Tobacco Use   Smoking status: Former   Smokeless tobacco: Never  Substance Use Topics   Alcohol use: Not on file   FAMILY HX:  Family History  Problem Relation Age of Onset   Heart disease Son    Heart attack Father    Heart attack Brother    Heart attack Other        Preferred Pharmacy: ALLERGIES: No Known Allergies   PERTINENT MEDICATIONS:  Outpatient Encounter Medications as of 12/21/2021  Medication Sig   amLODipine (NORVASC) 5 MG tablet Take 5 mg by mouth daily.   aspirin 81 MG tablet Take 81 mg by mouth daily.   ipratropium  (ATROVENT) 0.03 % nasal spray Place 2 sprays into both nostrils 2 (two) times daily as needed for rhinitis.   metoprolol succinate (TOPROL-XL) 50 MG 24 hr tablet Take 25 mg by mouth daily.   nitroGLYCERIN (NITROSTAT) 0.4 MG SL tablet Place 1 tablet (0.4 mg total) under the tongue every 5 (five) minutes as needed for chest pain.   tamsulosin (FLOMAX) 0.4 MG CAPS capsule Take 0.4 mg by mouth daily.   No facility-administered encounter medications on file as of 12/21/2021.   Daughter reports he takes the following for sleep: Benadryl 25 mg QHS Temazepam 30 mg QHS ---------------------------------------------------------------------------------------------------------------------------------------------------------------------------------------------------------------- Advance Care Planning/Goals of Care: Goals include to maximize quality of life and symptom management. Patient gave his permission to discuss.Our advance care planning conversation included a discussion about:    The value and importance of advance care planning-has DNR and living will Experiences with loved ones who have been seriously ill or have died  Exploration of personal, cultural or spiritual beliefs that might influence medical decisions -Does not want placement, wants to remain in his own home and family is supportive of request. Exploration of goals of care in the event of a sudden injury or illness-DNR  Identification  of a  healthcare agent  Review of a MOST form . Decision not to resuscitate or to de-escalate disease focused treatments due to poor prognosis. CODE STATUS: DNR     Thank you for the opportunity to participate in the care of Terry [name].  The palliative care team will continue to follow. Please call our office at [phone] if we can be of additional assistance.   Marijo Conception, FNP-C  COVID-19 PATIENT SCREENING TOOL Asked and negative response unless otherwise noted:  Have you had symptoms of  covid, tested positive or been in contact with someone with symptoms/positive test in the past 5-10 days? unknown

## 2021-12-27 ENCOUNTER — Encounter: Payer: Self-pay | Admitting: Family Medicine

## 2021-12-27 DIAGNOSIS — Z515 Encounter for palliative care: Secondary | ICD-10-CM | POA: Insufficient documentation

## 2022-01-13 ENCOUNTER — Other Ambulatory Visit: Payer: Self-pay

## 2022-01-13 ENCOUNTER — Other Ambulatory Visit: Payer: Medicare HMO | Admitting: Family Medicine

## 2022-01-13 ENCOUNTER — Other Ambulatory Visit: Payer: Self-pay | Admitting: Family Medicine

## 2022-01-13 ENCOUNTER — Encounter: Payer: Self-pay | Admitting: Family Medicine

## 2022-01-13 VITALS — BP 136/60 | HR 70 | Temp 97.7°F | Resp 16

## 2022-01-13 DIAGNOSIS — Z515 Encounter for palliative care: Secondary | ICD-10-CM

## 2022-01-13 DIAGNOSIS — R5381 Other malaise: Secondary | ICD-10-CM

## 2022-01-13 NOTE — Progress Notes (Signed)
? ? ?Manufact[facility] ?[facility] Consult Note ?Telephone: [phone]  ?Fax: [phone]  ? ? ?Date of encounter: 01/13/22 ?10:53 PM ?PATIENT NAME: Terry [name] ?Terry [name] [address]   ?[phone] (home)  ?DOB: Jul 14, 1924 ?MRN: [id number] ?PRIMARY CARE PROVIDER:    ?Terry [name], L.Terry Sa, MD,  ?[address] ?[place] [address] ?[phone] ? ?REFERRING PROVIDER:   ?Terry [name], L.Terry Sa, MD ?[address] ?[address] ?[place],  [place] [address] ?[phone] ? ?RESPONSIBLE PARTY:    ?Contact Information   ? ? Name Relation Home Work Mobile  ? Terry [name]   [phone]  ? Terry [name] Daughter   [phone]  ? ?  ? ? ? ?I met face to face with patient and family in his home. Palliative Care was asked to follow this patient by consultation request of  Terry [name], L.Terry Sa, MD to address advance care planning and complex medical decision making. This is a follow up visit. ? ?                                 ASSESSMENT, SYMPTOM MANAGEMENT AND PLAN / RECOMMENDATIONS:  ? Palliative Care Encounter ?Completed MOST ? ? Physical deconditioning ?Advised patient to use cane in home or outside of home to improve stability, decrease fall risk. ?Encouraged pt to use either Unisom or Trazodone for sleep but not both to decrease fall risk. ? ?Advance Care Planning/Goals of Care: Goals include to maximize quality of life and symptom management. Patient and health care surrogates gave their permission to discuss. ?Our advance care planning conversation included a discussion about:    ?  ?Experiences with loved ones who have been seriously ill or have died-Pt's sister had to make a decision to remove brother-in-law from life support. ?     Pt's wife had significant decline and because she  ?     Was not going to be able to return to prior quality of ?     Life, family opted to put her in Hospice care and not ?     Resuscitate. ?Exploration of personal, cultural or spiritual  beliefs that might influence medical decisions  ?Exploration of goals of care in the event of a sudden injury or illness  ?Identification of a healthcare agent-daughter is [facility] POA and has support of brothers ?Review and updating or creation of MOST . ?Decision not to resuscitate or to de-escalate disease focused treatments due to poor prognosis. ?CODE STATUS: ?MOST as of 01/13/22: ?DNR/DNI ?Comfort measures ?IV fluids and antibiotics on case by case, time limited basis ?No feeding tube ? ? ? ?Follow up Palliative Care Visit: Palliative care will continue to follow for complex medical decision making, advance care planning, and clarification of goals. Return 4 weeks or prn. ? ?This visit was coded based on medical decision making (MDM). ? ?PPS: 60% ? ?HOSPICE ELIGIBILITY/DIAGNOSIS: TBD ? ?Chief Complaint:  ?Palliative Care is following patient with advanced age, CAD and daughter Terry [name] who is [name] POA requested visit for NP to discuss MOST form/decisions with pt, herself and her brother.  Pt has DNR. ? ?HISTORY OF PRESENT ILLNESS:  Terry [name] is a 88 y.o. year old male with HLD, CAD, Mitral valve regurgitation, HTN and physical deconditioning with hx of falls. Family has requested meeting to review and complete MOST.  Pt denies CP, SOB, nausea, vomiting, constipation.  He is independent with bathing and dressing, continent of  bowel and bladder. He is eating well per his own and family report but continues to lose weight.  He has been using a cane primarily outside the home and has had several falls in the home but none recently.  He is anxious per family if he thinks he is not going to get sufficient sleep and takes Unisom and Trazodone nightly.  Have asked about taking one or the other to see if he can sleep so he doesn't become more off balance. Advised that he should use his cane in the home to give more stability. ? ?History obtained from review of EMR, interview with family,  and/or [name].  ?I reviewed  available labs, medications, imaging, studies and related documents from the EMR.  Records reviewed and summarized above.  ? ?ROS ? ?General: NAD ?EYES: denies vision changes ?ENMT: denies dysphagia ?Cardiovascular: denies chest pain, denies DOE ?Pulmonary: denies cough, denies increased SOB ?Abdomen: endorses good appetite, denies constipation, endorses continence of bowel ?GU: denies dysuria, endorses continence of urine ?MSK:  Reports increased weakness, no recent falls reported ?Skin: denies rashes or wounds ?Neurological: denies pain, denies insomnia ?Psych: Endorses positive mood ?Heme/lymph/immuno: denies bruises, abnormal bleeding ? ?Physical Exam: ?Current and past weights: Since October 2022, weight is down 12 lbs (last weight 196 on 11/13/2021) ?Constitutional: NAD ?General: frail appearing, thin ?EYES: anicteric sclera, lids intact, no discharge  ?ENMT: mildly HOH, oral mucous membranes moist, dentition intact ?CV: S1S2, RRR, no LE edema ?Pulmonary: CTAB, no increased work of breathing, no cough, room air ?Abdomen: normo-active BS + 4 quadrants, soft and non tender, no ascites ?GU: deferred ?MSK: no sarcopenia, moves all extremities, ambulatory ?Skin: warm and dry, no rashes or wounds on visible skin ?Neuro:  no generalized weakness,  no cognitive impairment ?Psych: non-anxious affect, A and O x 3 ?Hem/lymph/immuno: no widespread bruising ? ? ?Thank you for the opportunity to participate in the care of Terry [name].  The palliative care team will continue to follow. Please call our office at [phone] if we can be of additional assistance.  ? ?Marijo Conception, FNP -C ? ?COVID-19 PATIENT SCREENING TOOL ?Asked and negative response unless otherwise noted:  ? ?Have you had symptoms of covid, tested positive or been in contact with someone with symptoms/positive test in the past 5-10 days?  No ?

## 2022-02-03 ENCOUNTER — Emergency Department (HOSPITAL_BASED_OUTPATIENT_CLINIC_OR_DEPARTMENT_OTHER): Payer: Medicare HMO

## 2022-02-03 ENCOUNTER — Emergency Department (HOSPITAL_BASED_OUTPATIENT_CLINIC_OR_DEPARTMENT_OTHER)
Admission: EM | Admit: 2022-02-03 | Discharge: 2022-02-03 | Disposition: A | Discharge status: Home / Self Care | Payer: Medicare HMO | Attending: Emergency Medicine | Admitting: Emergency Medicine

## 2022-02-03 ENCOUNTER — Encounter (HOSPITAL_BASED_OUTPATIENT_CLINIC_OR_DEPARTMENT_OTHER): Payer: Self-pay | Admitting: Urology

## 2022-02-03 ENCOUNTER — Other Ambulatory Visit: Payer: Self-pay

## 2022-02-03 DIAGNOSIS — I2510 Atherosclerotic heart disease of native coronary artery without angina pectoris: Secondary | ICD-10-CM | POA: Insufficient documentation

## 2022-02-03 DIAGNOSIS — W19XXXA Unspecified fall, initial encounter: Secondary | ICD-10-CM

## 2022-02-03 DIAGNOSIS — S0101XA Laceration without foreign body of scalp, initial encounter: Secondary | ICD-10-CM | POA: Diagnosis not present

## 2022-02-03 DIAGNOSIS — Z79899 Other long term (current) drug therapy: Secondary | ICD-10-CM | POA: Diagnosis not present

## 2022-02-03 DIAGNOSIS — Z951 Presence of aortocoronary bypass graft: Secondary | ICD-10-CM | POA: Insufficient documentation

## 2022-02-03 DIAGNOSIS — S0990XA Unspecified injury of head, initial encounter: Secondary | ICD-10-CM | POA: Diagnosis not present

## 2022-02-03 DIAGNOSIS — Z7982 Long term (current) use of aspirin: Secondary | ICD-10-CM | POA: Diagnosis not present

## 2022-02-03 DIAGNOSIS — I10 Essential (primary) hypertension: Secondary | ICD-10-CM | POA: Diagnosis not present

## 2022-02-03 DIAGNOSIS — W1830XA Fall on same level, unspecified, initial encounter: Secondary | ICD-10-CM | POA: Diagnosis not present

## 2022-02-03 DIAGNOSIS — S199XXA Unspecified injury of neck, initial encounter: Secondary | ICD-10-CM | POA: Diagnosis not present

## 2022-02-03 NOTE — ED Notes (Signed)
Patient transported to CT at this Time. ?

## 2022-02-03 NOTE — ED Notes (Signed)
MD at the Bedside. 

## 2022-02-03 NOTE — ED Provider Notes (Signed)
?[facility] ?Provider Note ? ? ?CSN: [id number] ?Arrival date & time: 02/03/22  1421 ? ?  ? ?History ? ?Chief Complaint  ?Patient presents with  ? Fall  ? Head Laceration  ? ? ?CHARLI LIBERATORE is a 88 y.o. male. ? ? ?Fall ? ?Head Laceration ? ? ? 88 year old male with medical hx significant for CAD s/p CABG in 2007, HTN, atrial fibrillation s/p MAZE procedure, disc surgery in the lumbar spine, who presents to the ED as a non-leveled trauma after a mechanical ground level fall. He was ambulating in his car port, should be walking with a cane but doesn't use it at home, lost his balance and fell. Tetanus up to date in 2020. Landed on concrete, denies LOC. No other injuries beyond a small scalp laceration. At his baseline mental status, initially had mild memory loss surrounding the fall, was unsteady on his feet. Symptoms have since resolved. Arrived GCS 15, ABC intact. ? ?Home Medications ?Prior to Admission medications   ?Medication Sig Start Date End Date Taking? Authorizing Provider  ?amLODipine (NORVASC) 5 MG tablet Take 5 mg by mouth daily.    [provider]  ?aspirin 81 MG tablet Take 81 mg by mouth daily.    [provider]  ?diphenhydrAMINE (BENADRYL) 25 mg capsule Take 25 mg by mouth at bedtime as needed for sleep.    [provider]  ?ipratropium (ATROVENT) 0.03 % nasal spray Place 2 sprays into both nostrils 2 (two) times daily as needed for rhinitis.    [provider]  ?metoprolol succinate (TOPROL-XL) 50 MG 24 hr tablet Take 25 mg by mouth daily.    [provider]  ?nitroGLYCERIN (NITROSTAT) 0.4 MG SL tablet Place 1 tablet (0.4 mg total) under the tongue every 5 (five) minutes as needed for chest pain. 11/13/21   Jettie Booze, MD  ?tamsulosin (FLOMAX) 0.4 MG CAPS capsule Take 0.4 mg by mouth daily. 03/14/20   [provider]  ?temazepam (RESTORIL) 30 MG capsule Take 30 mg by mouth at bedtime as needed for sleep.     [provider]  ?   ? ?Allergies    ?Patient has no known allergies.   ? ?Review of Systems   ?Review of Systems  ?All other systems reviewed and are negative. ? ?Physical Exam ?Updated Vital Signs ?BP (!) 165/65   Pulse 73   Temp 98.4 ?F (36.9 ?C) (Oral)   Resp 17   Ht '6\' 2"'$  (1.88 m)   Wt 88.9 kg   SpO2 98%   BMI 25.16 kg/m?  ?Physical Exam ?Vitals and nursing note reviewed.  ?Constitutional:   ?   Appearance: He is well-developed.  ?   Comments: GCS 15, ABC intact  ?HENT:  ?   Head: Normocephalic.  ?   Comments: Small superficial 4 cm laceration in a zigzag pattern along the patient's posterior occiput, hemostatic and well approximated ?Eyes:  ?   Conjunctiva/sclera: Conjunctivae normal.  ?Neck:  ?   Comments: No midline tenderness to palpation of the cervical spine. ROM intact. ?Cardiovascular:  ?   Rate and Rhythm: Normal rate and regular rhythm.  ?Pulmonary:  ?   Effort: Pulmonary effort is normal. No respiratory distress.  ?   Breath sounds: Normal breath sounds.  ?Chest:  ?   Comments: Chest wall stable and non-tender to AP and lateral compression. Clavicles stable and non-tender to AP compression ?Abdominal:  ?   Palpations: Abdomen is soft.  ?  Tenderness: There is no abdominal tenderness.  ?   Comments: Pelvis stable to lateral compression.  ?Musculoskeletal:  ?   Cervical back: Neck supple.  ?   Comments: No midline tenderness to palpation of the thoracic or lumbar spine. Extremities atraumatic with intact ROM.   ?Skin: ?   General: Skin is warm and dry.  ?Neurological:  ?   Mental Status: He is alert.  ?   Comments: CN II-XII grossly intact. Moving all four extremities spontaneously and sensation grossly intact.  ? ? ?ED Results / Procedures / Treatments   ?Labs ?(all labs ordered are listed, but only abnormal results are displayed) ?Labs Reviewed - No data to display ? ?EKG ?None ? ?Radiology ?CT Head Wo Contrast ? ?Result Date: 02/03/2022 ?CLINICAL DATA:  Head trauma, minor (Age >=  65y); Neck trauma (Age >= 65y) EXAM: CT HEAD WITHOUT CONTRAST CT CERVICAL SPINE WITHOUT CONTRAST TECHNIQUE: Multidetector CT imaging of the head and cervical spine was performed following the standard protocol without intravenous contrast. Multiplanar CT image reconstructions of the cervical spine were also generated. RADIATION DOSE REDUCTION: This exam was performed according to the departmental dose-optimization program which includes automated exposure control, adjustment of the mA and/or kV according to patient size and/or use of iterative reconstruction technique. COMPARISON:  CT head from August 01, 2021. FINDINGS: CT HEAD FINDINGS Brain: No evidence of acute infarction, hemorrhage, hydrocephalus, extra-axial collection or mass lesion/mass effect. Patchy white matter hypoattenuation, nonspecific but compatible chronic microvascular ischemic disease. Small remote right cerebellar lacunar infarcts. Cerebral atrophy. Vascular: Calcific intracranial atherosclerosis. No hyperdense vessel identified. Skull: No acute fracture. Sinuses/Orbits: Mild paranasal sinus mucosal thickening. Other: No mastoid effusions. CT CERVICAL SPINE FINDINGS Alignment: No substantial sagittal subluxation. Broad levocurvature. Skull base and vertebrae: No evidence of acute fracture Soft tissues and spinal canal: No prevertebral fluid or swelling. No visible canal hematoma. Disc levels: Moderate multilevel degenerative change. Multilevel facet and uncovertebral hypertrophy with varying degrees of neural foraminal stenosis. Multilevel degenerative disc disease including disc height loss, vacuum disc phenomena and endplate spurring. Upper chest: Biapical pleuroparenchymal scarring. Otherwise, visualized lung apices are clear. Other: Subcentimeter left thyroid nodule, which does not require further imaging follow-up per current guidelines (ref: J Am Coll Radiol. 2015 Feb;12(2): 143-50). IMPRESSION: CT head: 1. No evidence of acute  intracranial abnormality. 2. Chronic microvascular ischemic disease and cerebral atrophy. CT cervical spine: 1. No evidence of acute fracture or traumatic malalignment the cervical spine. 2. Moderate multilevel degenerative change, detailed above. Electronically Signed   By: Margaretha Sheffield M.D.   On: 02/03/2022 16:39  ? ?CT Cervical Spine Wo Contrast ? ?Result Date: 02/03/2022 ?CLINICAL DATA:  Head trauma, minor (Age >= 65y); Neck trauma (Age >= 65y) EXAM: CT HEAD WITHOUT CONTRAST CT CERVICAL SPINE WITHOUT CONTRAST TECHNIQUE: Multidetector CT imaging of the head and cervical spine was performed following the standard protocol without intravenous contrast. Multiplanar CT image reconstructions of the cervical spine were also generated. RADIATION DOSE REDUCTION: This exam was performed according to the departmental dose-optimization program which includes automated exposure control, adjustment of the mA and/or kV according to patient size and/or use of iterative reconstruction technique. COMPARISON:  CT head from August 01, 2021. FINDINGS: CT HEAD FINDINGS Brain: No evidence of acute infarction, hemorrhage, hydrocephalus, extra-axial collection or mass lesion/mass effect. Patchy white matter hypoattenuation, nonspecific but compatible chronic microvascular ischemic disease. Small remote right cerebellar lacunar infarcts. Cerebral atrophy. Vascular: Calcific intracranial atherosclerosis. No hyperdense vessel identified. Skull: No acute fracture. Sinuses/Orbits: Mild  paranasal sinus mucosal thickening. Other: No mastoid effusions. CT CERVICAL SPINE FINDINGS Alignment: No substantial sagittal subluxation. Broad levocurvature. Skull base and vertebrae: No evidence of acute fracture Soft tissues and spinal canal: No prevertebral fluid or swelling. No visible canal hematoma. Disc levels: Moderate multilevel degenerative change. Multilevel facet and uncovertebral hypertrophy with varying degrees of neural foraminal  stenosis. Multilevel degenerative disc disease including disc height loss, vacuum disc phenomena and endplate spurring. Upper chest: Biapical pleuroparenchymal scarring. Otherwise, visualized lung apices are clear. Other: Subcenti

## 2022-02-03 NOTE — ED Triage Notes (Signed)
Unwitnessed fall this am in garage ?Hit back of head ?Laceration to scalp noted, bleeding controlled ?Not on blood thinner ?Trouble telling daughter what happened  ?A& O x4  ? ?

## 2022-02-03 NOTE — ED Notes (Signed)
Patient returned from Radiology. 

## 2022-02-03 NOTE — Discharge Instructions (Signed)
You were evaluated in the Emergency Department and after careful evaluation, we did not find any emergent condition requiring admission or further testing in the hospital. ? ?Your exam/testing today was overall reassuring.  Your CT imaging was negative for acute traumatic injuries.  You sustained a scalp laceration that was dermabonded in the ED.  Tissue adhesive wound care instructions have been attached. ? ?Please return to the Emergency Department if you experience any worsening of your condition.  Thank you for allowing [place] to be a part of your care. ? ?

## 2022-02-03 NOTE — ED Notes (Signed)
Patient has 4-5 cm Laceration shaped in "L" to Posterior Head from Fall today. Wound Irrigated and Cleansed with Wound Cleaner and NS. NAD Noted during Assessment. No Neck Tenderness. No Pain. ?

## 2022-04-07 ENCOUNTER — Telehealth: Payer: Self-pay

## 2022-04-07 NOTE — Telephone Encounter (Signed)
(  3:18 pm) SW left a message requesting a call back from patient's daughter.

## 2022-04-12 ENCOUNTER — Other Ambulatory Visit: Payer: Medicare HMO

## 2022-04-12 DIAGNOSIS — Z515 Encounter for palliative care: Secondary | ICD-10-CM

## 2022-04-12 NOTE — Progress Notes (Signed)
COMMUNITY PALLIATIVE CARE SW NOTE  PATIENT NAME: Terry [name] DOB: Dec 23, 1923 MRN: [id number]  PRIMARY CARE PROVIDER: Alroy Dust, L.Marlou Sa, MD  RESPONSIBLE PARTY:  Acct ID - Guarantor Home Phone Work Phone Relationship Acct Type  [id number] TRYSTYN, [phone]  Self P/F     [address] [place], [place] [address]   SOCIAL WORK TELEPHONIC VISIT  SW completed a telephonic visit with patient's daughter-Donna who provided a status update on patient. She advised that patient's short term memory is worsening and poses challenges to them. He is eating well. He has not had any falls since and emergency department visit due to a fall in April. Has private caregivers 7 x/week for 4 hours a day. [name] did not feel a palliative NP visit was needed at this time. Instead she asked that follow-up be done in 6/8 weeks. SW reinforced access to palliative care support and encouraged her to call with any questions or concerns.   [address] [place], [place]

## 2022-06-15 DIAGNOSIS — R2689 Other abnormalities of gait and mobility: Secondary | ICD-10-CM | POA: Diagnosis not present

## 2022-06-15 DIAGNOSIS — N1831 Chronic kidney disease, stage 3a: Secondary | ICD-10-CM | POA: Diagnosis not present

## 2022-06-15 DIAGNOSIS — I10 Essential (primary) hypertension: Secondary | ICD-10-CM | POA: Diagnosis not present

## 2022-06-15 DIAGNOSIS — R7309 Other abnormal glucose: Secondary | ICD-10-CM | POA: Diagnosis not present

## 2022-06-15 DIAGNOSIS — G4700 Insomnia, unspecified: Secondary | ICD-10-CM | POA: Diagnosis not present

## 2022-06-15 DIAGNOSIS — N400 Enlarged prostate without lower urinary tract symptoms: Secondary | ICD-10-CM | POA: Diagnosis not present

## 2022-06-15 DIAGNOSIS — D692 Other nonthrombocytopenic purpura: Secondary | ICD-10-CM | POA: Diagnosis not present

## 2022-06-15 DIAGNOSIS — E782 Mixed hyperlipidemia: Secondary | ICD-10-CM | POA: Diagnosis not present

## 2022-12-14 DIAGNOSIS — G4700 Insomnia, unspecified: Secondary | ICD-10-CM | POA: Diagnosis not present

## 2022-12-14 DIAGNOSIS — E1169 Type 2 diabetes mellitus with other specified complication: Secondary | ICD-10-CM | POA: Diagnosis not present

## 2022-12-14 DIAGNOSIS — E1122 Type 2 diabetes mellitus with diabetic chronic kidney disease: Secondary | ICD-10-CM | POA: Diagnosis not present

## 2022-12-14 DIAGNOSIS — N400 Enlarged prostate without lower urinary tract symptoms: Secondary | ICD-10-CM | POA: Diagnosis not present

## 2022-12-14 DIAGNOSIS — E782 Mixed hyperlipidemia: Secondary | ICD-10-CM | POA: Diagnosis not present

## 2022-12-14 DIAGNOSIS — Z23 Encounter for immunization: Secondary | ICD-10-CM | POA: Diagnosis not present

## 2022-12-14 DIAGNOSIS — N1831 Chronic kidney disease, stage 3a: Secondary | ICD-10-CM | POA: Diagnosis not present

## 2022-12-14 DIAGNOSIS — I10 Essential (primary) hypertension: Secondary | ICD-10-CM | POA: Diagnosis not present

## 2022-12-14 DIAGNOSIS — F39 Unspecified mood [affective] disorder: Secondary | ICD-10-CM | POA: Diagnosis not present

## 2022-12-14 DIAGNOSIS — R2689 Other abnormalities of gait and mobility: Secondary | ICD-10-CM | POA: Diagnosis not present

## 2023-01-17 NOTE — Progress Notes (Unsigned)
Cardiology Office Note   Date:  01/18/2023   ID:  Terry [name], DOB [birth year], MRN [id number]  PCP:  Alroy Dust, Carlean Jews.Marlou Sa, MD    No chief complaint on file.  CAD  Wt Readings from Last 3 Encounters:  01/18/23 180 lb (81.6 kg)  02/03/22 195 lb 15.8 oz (88.9 kg)  11/13/21 196 lb (88.9 kg)       History of Present Illness: Terry [name] is a 89 y.o. male  male  who has CAD.  Coronary artery bypass grafting x3 (left internal mammary artery to      left anterior descending, saphenous vein graft to circumflex      marginal, saphenous vein graft to posterior descending) in 2008 with Mitral valve annuloplasty repair for ischemic mitral regurgitation      with a 28 mm McCarthy-Adams ring.   Left atrial and right atrial MAZE procedure for preoperative atrial      flutter.   Stapling of left atrial appendage.   He did exercise at [facility] in [place] 3x/week before COVID-19.   In the past, used weights and bike at home.     In 2019, he had some DOE.  Plan was :"COuld increase HCTZ to one full tab per week, if his swelling gets worse or if there are other signs of volume overload. "   In 2022, he was hospitalized: "89 year old M with PMH of CAD/CABG, A. Fib and HTN presenting with confusion and poor p.o. intake, and admitted with AKI, altered mental status and dehydration. CR 3.94 with BUN of 96.  Renal ultrasound without significant finding other than 3.2 cm benign renal cyst.  He was started on IV fluid hydration with improvement in his renal function and mental status.  On the day of discharge, creatinine down to 1.38 which is about his baseline.  Mental status improved.  He was oriented to self, place and person.  He is discharged to follow-up with PCP.  Discontinued lisinopril and HCTZ in the setting of AKI, dehydration and poor p.o. intake.  Home health PT/OT and DME ordered as recommended by therapy."     Noted in January 2023: "He has some chronic rhinorrhea.  Worse when he eats.   Daughter wondering if it is related to allergies.     Gave up driving after family recs.  He sometimes found the keys and did some driving.    Appetite is good.  Family reports that he is not always careful about avoiding falling.  He sometimes uses his cane and sometimes does not." As of 2023: "Family realizes he is approaching end-of-life.  They feel like mentally, he has given up.  I think it is fine for him to eat things he enjoys.  I stressed the importance of avoiding falls.  He needs to drink plenty of water to avoid dehydration as well.  They have made him a DNR as well."   He continues to falls.  Not using a cane or walker regularly.    Denies : Chest pain. Dizziness. Leg edema. Nitroglycerin use. Orthopnea. Palpitations. Paroxysmal nocturnal dyspnea. Shortness of breath. Syncope.    Past Medical History:  Diagnosis Date   Arrhythmia    atrial fib   Coronary artery disease    Coronary atherosclerosis 12/28/2013   Mitral valve annuloplasty repair for ischemic mitral regurgitation      with a 28 mm McCarthy-Adams ring.   Left atrial and right atrial MAZE procedure for preoperative atrial  flutter.   Stapling of left atrial appendage. Coronary artery bypass grafting x3 (left internal mammary artery to      left anterior descending, saphenous vein graft to circumflex      marginal, saphenous vein graft to    Edema 05/22/2014   Left ankle slightly larger than right-prior vein harvest    Essential hypertension, benign 12/28/2013   Hypertension    Low back syndrome    Dr. Nelva Bush   Mitral valve regurgitation, ischemic 06/04/2015   S/p repair in 2008     Past Surgical History:  Procedure Laterality Date   CORONARY ARTERY BYPASS GRAFT  2008   MV REPLACEMENT   INGUINAL HERNIA REPAIR Bilateral    LUMBAR LAMINECTOMY     RIGHT KNEE ARTHROSCOPY       Current Outpatient Medications  Medication Sig Dispense Refill   amLODipine (NORVASC) 5 MG tablet Take 5 mg by mouth daily.      guaiFENesin (MUCINEX) 600 MG 12 hr tablet 1 tablets Orally once per day     metoprolol succinate (TOPROL-XL) 50 MG 24 hr tablet Take 25 mg by mouth daily.     nitroGLYCERIN (NITROSTAT) 0.4 MG SL tablet Place 1 tablet (0.4 mg total) under the tongue every 5 (five) minutes as needed for chest pain. 25 tablet 3   tamsulosin (FLOMAX) 0.4 MG CAPS capsule Take 0.4 mg by mouth daily.     No current facility-administered medications for this visit.    Allergies:   Patient has no known allergies.    Social History:  The patient  reports that he has quit smoking. He has never used smokeless tobacco.   Family History:  The patient's family history includes Heart attack in his brother, father, and another family member; Heart disease in his son.    ROS:  Please see the history of present illness.   Otherwise, review of systems are positive for falls.   All other systems are reviewed and negative.    PHYSICAL EXAM: VS:  BP (!) 122/56   Pulse (!) 58   Ht 6\' 1"  (1.854 m)   Wt 180 lb (81.6 kg)   SpO2 94%   BMI 23.75 kg/m  , BMI Body mass index is 23.75 kg/m. GEN: Well nourished, well developed, in no acute distress HEENT: normal Neck: no JVD, carotid bruits, or masses Cardiac: RRR; no murmurs, rubs, or gallops,no edema  Respiratory:  clear to auscultation bilaterally, normal work of breathing GI: soft, nontender, nondistended, + BS MS: no deformity or atrophy Skin: warm and dry, no rash Neuro:  Strength and sensation are intact Psych: euthymic mood, full affect   EKG:   The ekg ordered today demonstrates NSR, PACs, prolonged PR, RBBB, LAFB   Recent Labs: No results found for requested labs within last 365 days.   Lipid Panel    Component Value Date/Time   CHOL  05/30/2007 1837    154        ATP III CLASSIFICATION:  <200     mg/dL   Desirable  200-239  mg/dL   Borderline High  >=240    mg/dL   High   TRIG 65 05/30/2007 1837   HDL 52 05/30/2007 1837   CHOLHDL 3.0 05/30/2007  1837   VLDL 13 05/30/2007 1837   LDLCALC  05/30/2007 1837    89        Total Cholesterol/HDL:CHD Risk Coronary Heart Disease Risk Table  Men   Women  1/2 Average Risk   3.4   3.3     Other studies Reviewed: Additional studies/ records that were reviewed today with results demonstrating: Labs reviewed.  February 2024 A1c 6.1 creatinine 1.05 potassium 5.0   ASSESSMENT AND PLAN:  CAD: No angina on medical therapy.  Focusing more on patient comfort rather than aggressive secondary prevention.  I stressed the importance of not falling.  He has a walker and cane but rarely uses either.  He has fallen several times in the house.  He is not a candidate for any more aggressive antiplatelet therapy or anticoagulation. MV repair: Done at the time of his CABG.  He needs SBE prophylaxis for dental cleanings.  He does not want to go to the dentist anymore. Hyperlipidemia: Not eating that much.  Allowing him to eat what he wants.  No statin.  Prediabetes: Whole food, plant-based diet predominantly.  We have been more lax with his preventive recommendations due to his advanced age. Leg edema: Elevate legs. [place] to last year:Family realizes he is approaching end-of-life.  They feel like mentally, he has given up.  I think it is fine for him to eat things he enjoys.  I stressed the importance of avoiding falls.  He needs to drink plenty of water to avoid dehydration as well.  Remains a DNR as well.   Current medicines are reviewed at length with the patient today.  The patient concerns regarding his medicines were addressed.  The following changes have been made:  No change  Labs/ tests ordered today include:   Orders Placed This Encounter  Procedures   EKG 12-Lead    Recommend 150 minutes/week of aerobic exercise Low fat, low carb, high fiber diet recommended  Disposition:   FU in 1 year   Signed, Larae Grooms, MD  01/18/2023 4:24 PM    [facility]  [facility] [place], [place], [place]  [address] Phone: [phone]; Fax: [phone]

## 2023-01-18 ENCOUNTER — Encounter: Payer: Self-pay | Admitting: Interventional Cardiology

## 2023-01-18 ENCOUNTER — Ambulatory Visit: Payer: Medicare HMO | Attending: Interventional Cardiology | Admitting: Interventional Cardiology

## 2023-01-18 VITALS — BP 122/56 | HR 58 | Ht 73.0 in | Wt 180.0 lb

## 2023-01-18 DIAGNOSIS — R7303 Prediabetes: Secondary | ICD-10-CM

## 2023-01-18 DIAGNOSIS — I10 Essential (primary) hypertension: Secondary | ICD-10-CM

## 2023-01-18 DIAGNOSIS — E782 Mixed hyperlipidemia: Secondary | ICD-10-CM | POA: Diagnosis not present

## 2023-01-18 DIAGNOSIS — I2510 Atherosclerotic heart disease of native coronary artery without angina pectoris: Secondary | ICD-10-CM | POA: Diagnosis not present

## 2023-01-18 DIAGNOSIS — I4510 Unspecified right bundle-branch block: Secondary | ICD-10-CM

## 2023-01-18 NOTE — Patient Instructions (Signed)
Medication Instructions:  Your physician recommends that you continue on your current medications as directed. Please refer to the Current Medication list given to you today.  *If you need a refill on your cardiac medications before your next appointment, please call your pharmacy*   Lab Work: none If you have labs (blood work) drawn today and your tests are completely normal, you will receive your results only by: MyChart Message (if you have MyChart) OR A paper copy in the mail If you have any lab test that is abnormal or we need to change your treatment, we will call you to review the results.   Testing/Procedures: none   Follow-Up: At [facility], you and your health needs are our priority.  As part of our continuing mission to provide you with exceptional heart care, we have created designated Provider Care Teams.  These Care Teams include your primary Cardiologist (physician) and Advanced Practice Providers (APPs -  Physician Assistants and Nurse Practitioners) who all work together to provide you with the care you need, when you need it.  We recommend signing up for the patient portal called "MyChart".  Sign up information is provided on this After Visit Summary.  MyChart is used to connect with patients for Virtual Visits (Telemedicine).  Patients are able to view lab/test results, encounter notes, upcoming appointments, etc.  Non-urgent messages can be sent to your provider as well.   To learn more about what you can do with MyChart, go to [email or web address].    Your next appointment:   12 month(s)  Provider:   Jayadeep Varanasi, MD     Other Instructions    

## 2023-06-23 DIAGNOSIS — N400 Enlarged prostate without lower urinary tract symptoms: Secondary | ICD-10-CM | POA: Diagnosis not present

## 2023-06-23 DIAGNOSIS — E1169 Type 2 diabetes mellitus with other specified complication: Secondary | ICD-10-CM | POA: Diagnosis not present

## 2023-06-23 DIAGNOSIS — E782 Mixed hyperlipidemia: Secondary | ICD-10-CM | POA: Diagnosis not present

## 2023-06-23 DIAGNOSIS — Z9181 History of falling: Secondary | ICD-10-CM | POA: Diagnosis not present

## 2023-06-23 DIAGNOSIS — I10 Essential (primary) hypertension: Secondary | ICD-10-CM | POA: Diagnosis not present

## 2023-06-23 DIAGNOSIS — D692 Other nonthrombocytopenic purpura: Secondary | ICD-10-CM | POA: Diagnosis not present

## 2023-06-23 DIAGNOSIS — G4700 Insomnia, unspecified: Secondary | ICD-10-CM | POA: Diagnosis not present

## 2023-06-23 DIAGNOSIS — E1122 Type 2 diabetes mellitus with diabetic chronic kidney disease: Secondary | ICD-10-CM | POA: Diagnosis not present

## 2023-06-23 DIAGNOSIS — N1831 Chronic kidney disease, stage 3a: Secondary | ICD-10-CM | POA: Diagnosis not present

## 2023-06-23 DIAGNOSIS — R2689 Other abnormalities of gait and mobility: Secondary | ICD-10-CM | POA: Diagnosis not present

## 2024-10-25 DEATH — deceased
